# Patient Record
Sex: Female | Born: 1975 | Race: Black or African American | Hispanic: No | Marital: Single | Smoking: Never smoker
Health system: Southern US, Community
[De-identification: ages and names within clinical notes are randomized; demographics above are authoritative.]

## PROBLEM LIST (undated history)

## (undated) DIAGNOSIS — Z789 Other specified health status: Secondary | ICD-10-CM

## (undated) HISTORY — DX: Other specified health status: Z78.9

## (undated) HISTORY — PX: NO PAST SURGERIES: SHX2092

---

## 2015-08-02 LAB — OB RESULTS CONSOLE ABO/RH: RH Type: POSITIVE

## 2015-08-02 LAB — OB RESULTS CONSOLE HIV ANTIBODY (ROUTINE TESTING): HIV: NONREACTIVE

## 2015-08-02 LAB — OB RESULTS CONSOLE HGB/HCT, BLOOD: Hemoglobin: 12.1 g/dL

## 2015-08-02 LAB — OB RESULTS CONSOLE PLATELET COUNT: PLATELETS: 198 10*3/uL

## 2015-09-10 LAB — OB RESULTS CONSOLE HGB/HCT, BLOOD: Hemoglobin: 12 g/dL

## 2015-09-10 LAB — OB RESULTS CONSOLE PLATELET COUNT: PLATELETS: 172 10*3/uL

## 2015-12-24 LAB — OB RESULTS CONSOLE HGB/HCT, BLOOD: HEMOGLOBIN: 12.3 g/dL

## 2015-12-24 LAB — OB RESULTS CONSOLE PLATELET COUNT: PLATELETS: 142 10*3/uL

## 2016-01-18 ENCOUNTER — Encounter: Payer: Self-pay | Admitting: Student

## 2016-01-18 ENCOUNTER — Encounter: Payer: Self-pay | Admitting: *Deleted

## 2016-01-18 ENCOUNTER — Ambulatory Visit (INDEPENDENT_AMBULATORY_CARE_PROVIDER_SITE_OTHER): Payer: Self-pay | Admitting: Student

## 2016-01-18 VITALS — BP 100/54 | HR 74 | Wt 188.6 lb

## 2016-01-18 DIAGNOSIS — O26893 Other specified pregnancy related conditions, third trimester: Secondary | ICD-10-CM | POA: Diagnosis not present

## 2016-01-18 DIAGNOSIS — Z1151 Encounter for screening for human papillomavirus (HPV): Secondary | ICD-10-CM

## 2016-01-18 DIAGNOSIS — Z23 Encounter for immunization: Secondary | ICD-10-CM

## 2016-01-18 DIAGNOSIS — N898 Other specified noninflammatory disorders of vagina: Secondary | ICD-10-CM | POA: Diagnosis not present

## 2016-01-18 DIAGNOSIS — Z3490 Encounter for supervision of normal pregnancy, unspecified, unspecified trimester: Secondary | ICD-10-CM | POA: Insufficient documentation

## 2016-01-18 DIAGNOSIS — O0933 Supervision of pregnancy with insufficient antenatal care, third trimester: Secondary | ICD-10-CM

## 2016-01-18 LAB — POCT URINALYSIS DIP (DEVICE)
BILIRUBIN URINE: NEGATIVE
Glucose, UA: NEGATIVE mg/dL
HGB URINE DIPSTICK: NEGATIVE
KETONES UR: NEGATIVE mg/dL
Leukocytes, UA: NEGATIVE
NITRITE: NEGATIVE
PH: 7 (ref 5.0–8.0)
PROTEIN: NEGATIVE mg/dL
Specific Gravity, Urine: 1.02 (ref 1.005–1.030)
Urobilinogen, UA: 1 mg/dL (ref 0.0–1.0)

## 2016-01-18 NOTE — Progress Notes (Signed)
  Subjective:    Alejandra Peterson is a G4P0030 183w4d being seen today for her first obstetrical visit.  Her obstetrical history is significant for advanced maternal age, large for gestational age and late [place] Patient does intend to breast feed. FOB not involved. Pregnancy history fully reviewed.  Patient reports no complaints.  Filed Vitals:   01/18/16 0940  BP: 100/54  Pulse: 74  Weight: 188 lb 9.6 oz (85.548 kg)    HISTORY: OB History  Gravida Para Term Preterm AB SAB TAB Ectopic Multiple Living  4 0 0 0 3 3 0 0 0 0     # Outcome Date GA Lbr Len/2nd Weight Sex Delivery Anes PTL Lv  4 Current           3 SAB           2 SAB           1 SAB              Past Medical History  Diagnosis Date  . Medical history non-contributory    Past Surgical History  Procedure Laterality Date  . No past surgeries     No family history on file.   Exam   FHT 147 by doppler  Uterus:  Fundal Height: 35 cm  Pelvic Exam:    Perineum: No Hemorrhoids   Vulva: normal   Vagina:  thin grey discharge, wet prep done   Cervix: no bleeding following Pap, no lesions, nulliparous appearance    Skin: normal coloration and turgor, no rashes    Neurologic: oriented, normal   Extremities: normal strength, tone, and muscle mass, no deformities   HEENT PERRLA   Mouth/Teeth mucous membranes moist, pharynx normal without lesions and dental hygiene good   Neck supple and no masses   Cardiovascular: regular rate and rhythm, no murmurs or gallops   Respiratory:  appears well, vitals normal, no respiratory distress, acyanotic, normal RR, neck free of mass or lymphadenopathy   Abdomen: soft, non-tender; bowel sounds normal; no masses,  no organomegaly   Urinary: urethral meatus normal      Assessment:    Pregnancy: G4P0030 Patient Active Problem List   Diagnosis Date Noted  . Supervision of low-risk pregnancy 01/18/2016        Plan:  1. Late prenatal  care, third trimester  - Prescript Monitor Profile(19) - Prenatal Profile - Hemoglobinopathy evaluation - Culture, OB Urine - GC/Chlamydia probe amp ([place]not [facility] - US MFM OB COMP + 14 WK; Future - Glucose Tolerance, 1 HR (50g) w/o Fasting - Cytology - PAP - Tdap vaccine greater than or equal to 7yo IM - Flu Vaccine QUAD 36+ mos IM  2. Supervision of low-risk pregnancy, unspecified trimester   3. Vaginal discharge during pregnancy in third trimester  - Wet prep, genital    Initial labs drawn. Prenatal vitamins. Problem list reviewed and updated.  Ultrasound discussed; fetal survey: ordered.  Follow up in 2 weeks.    Judeth Hornrin Jadie Allington 01/18/2016

## 2016-01-18 NOTE — Progress Notes (Signed)
tdap given Flu given PAP Needed

## 2016-01-18 NOTE — Patient Instructions (Signed)
Third Trimester of Pregnancy The third trimester is from week 29 through week 42, months 7 through 9. The third trimester is a time when the fetus is growing rapidly. At the end of the ninth month, the fetus is about 20 inches in length and weighs 6-10 pounds.  BODY CHANGES Your body goes through many changes during pregnancy. The changes vary from woman to woman.   Your weight will continue to increase. You can expect to gain 25-35 pounds (11-16 kg) by the end of the pregnancy.  You may begin to get stretch marks on your hips, abdomen, and breasts.  You may urinate more often because the fetus is moving lower into your pelvis and pressing on your bladder.  You may develop or continue to have heartburn as a result of your pregnancy.  You may develop constipation because certain hormones are causing the muscles that push waste through your intestines to slow down.  You may develop hemorrhoids or swollen, bulging veins (varicose veins).  You may have pelvic pain because of the weight gain and pregnancy hormones relaxing your joints between the bones in your pelvis. Backaches may result from overexertion of the muscles supporting your posture.  You may have changes in your hair. These can include thickening of your hair, rapid growth, and changes in texture. Some women also have hair loss during or after pregnancy, or hair that feels dry or thin. Your hair will most likely return to normal after your baby is born.  Your breasts will continue to grow and be tender. A yellow discharge may leak from your breasts called colostrum.  Your belly button may stick out.  You may feel short of breath because of your expanding uterus.  You may notice the fetus "dropping," or moving lower in your abdomen.  You may have a bloody mucus discharge. This usually occurs a few days to a week before labor begins.  Your cervix becomes thin and soft (effaced) near your due date. WHAT TO EXPECT AT YOUR PRENATAL  EXAMS  You will have prenatal exams every 2 weeks until week 36. Then, you will have weekly prenatal exams. During a routine prenatal visit:  You will be weighed to make sure you and the fetus are growing normally.  Your blood pressure is taken.  Your abdomen will be measured to track your baby's growth.  The fetal heartbeat will be listened to.  Any test results from the previous visit will be discussed.  You may have a cervical check near your due date to see if you have effaced. At around 36 weeks, your caregiver will check your cervix. At the same time, your caregiver will also perform a test on the secretions of the vaginal tissue. This test is to determine if a type of bacteria, Group B streptococcus, is present. Your caregiver will explain this further. Your caregiver may ask you:  What your birth plan is.  How you are feeling.  If you are feeling the baby move.  If you have had any abnormal symptoms, such as leaking fluid, bleeding, severe headaches, or abdominal cramping.  If you are using any tobacco products, including cigarettes, chewing tobacco, and electronic cigarettes.  If you have any questions. Other tests or screenings that may be performed during your third trimester include:  Blood tests that check for low iron levels (anemia).  Fetal testing to check the health, activity level, and growth of the fetus. Testing is done if you have certain medical conditions or if   there are problems during the pregnancy.  HIV (human immunodeficiency virus) testing. If you are at high risk, you may be screened for HIV during your third trimester of pregnancy. FALSE LABOR You may feel small, irregular contractions that eventually go away. These are called Braxton Hicks contractions, or false labor. Contractions may last for hours, days, or even weeks before true labor sets in. If contractions come at regular intervals, intensify, or become painful, it is best to be seen by your  caregiver.  SIGNS OF LABOR   Menstrual-like cramps.  Contractions that are 5 minutes apart or less.  Contractions that start on the top of the uterus and spread down to the lower abdomen and back.  A sense of increased pelvic pressure or back pain.  A watery or bloody mucus discharge that comes from the vagina. If you have any of these signs before the 37th week of pregnancy, call your caregiver right away. You need to go to the hospital to get checked immediately. HOME CARE INSTRUCTIONS   Avoid all smoking, herbs, alcohol, and unprescribed drugs. These chemicals affect the formation and growth of the baby.  Do not use any tobacco products, including cigarettes, chewing tobacco, and electronic cigarettes. If you need help quitting, ask your health care provider. You may receive counseling support and other resources to help you quit.  Follow your caregiver's instructions regarding medicine use. There are medicines that are either safe or unsafe to take during pregnancy.  Exercise only as directed by your caregiver. Experiencing uterine cramps is a good sign to stop exercising.  Continue to eat regular, healthy meals.  Wear a good support bra for breast tenderness.  Do not use hot tubs, steam rooms, or saunas.  Wear your seat belt at all times when driving.  Avoid raw meat, uncooked cheese, cat litter boxes, and soil used by cats. These carry germs that can cause birth defects in the baby.  Take your prenatal vitamins.  Take 1500-2000 mg of calcium daily starting at the 20th week of pregnancy until you deliver your baby.  Try taking a stool softener (if your caregiver approves) if you develop constipation. Eat more high-fiber foods, such as fresh vegetables or fruit and whole grains. Drink plenty of fluids to keep your urine clear or pale yellow.  Take warm sitz baths to soothe any pain or discomfort caused by hemorrhoids. Use hemorrhoid cream if your caregiver approves.  If  you develop varicose veins, wear support hose. Elevate your feet for 15 minutes, 3-4 times a day. Limit salt in your diet.  Avoid heavy lifting, wear low heal shoes, and practice good posture.  Rest a lot with your legs elevated if you have leg cramps or low back pain.  Visit your dentist if you have not gone during your pregnancy. Use a soft toothbrush to brush your teeth and be gentle when you floss.  A sexual relationship may be continued unless your caregiver directs you otherwise.  Do not travel far distances unless it is absolutely necessary and only with the approval of your caregiver.  Take prenatal classes to understand, practice, and ask questions about the labor and delivery.  Make a trial run to the hospital.  Pack your hospital bag.  Prepare the baby's nursery.  Continue to go to all your prenatal visits as directed by your caregiver. SEEK MEDICAL CARE IF:  You are unsure if you are in labor or if your water has broken.  You have dizziness.  You have   mild pelvic cramps, pelvic pressure, or nagging pain in your abdominal area.  You have persistent nausea, vomiting, or diarrhea.  You have a bad smelling vaginal discharge.  You have pain with urination. SEEK IMMEDIATE MEDICAL CARE IF:   You have a fever.  You are leaking fluid from your vagina.  You have spotting or bleeding from your vagina.  You have severe abdominal cramping or pain.  You have rapid weight loss or gain.  You have shortness of breath with chest pain.  You notice sudden or extreme swelling of your face, hands, ankles, feet, or legs.  You have not felt your baby move in over an hour.  You have severe headaches that do not go away with medicine.  You have vision changes.   This information is not intended to replace advice given to you by your health care provider. Make sure you discuss any questions you have with your health care provider.   Document Released: 08/28/2001 Document  Revised: 09/24/2014 Document Reviewed: 11/04/2012 Elsevier Interactive Patient Education 2016 [employer]   Preterm Labor Information Preterm labor is when labor starts at less than 37 weeks of pregnancy. The normal length of a pregnancy is 39 to 41 weeks. CAUSES Often, there is no identifiable underlying cause as to why a woman goes into preterm labor. One of the most common known causes of preterm labor is infection. Infections of the uterus, cervix, vagina, amniotic sac, bladder, kidney, or even the lungs (pneumonia) can cause labor to start. Other suspected causes of preterm labor include:   Urogenital infections, such as yeast infections and bacterial vaginosis.   Uterine abnormalities (uterine shape, uterine septum, fibroids, or bleeding from the placenta).   A cervix that has been operated on (it may fail to stay closed).   Malformations in the fetus.   Multiple gestations (twins, triplets, and so on).   Breakage of the amniotic sac.  RISK FACTORS  Having a previous history of preterm labor.   Having premature rupture of membranes (PROM).   Having a placenta that covers the opening of the cervix (placenta previa).   Having a placenta that separates from the uterus (placental abruption).   Having a cervix that is too weak to hold the fetus in the uterus (incompetent cervix).   Having too much fluid in the amniotic sac (polyhydramnios).   Taking illegal drugs or smoking while pregnant.   Not gaining enough weight while pregnant.   Being younger than 18 and older than 39 years old.   Having a low socioeconomic status.   Being African American. SYMPTOMS Signs and symptoms of preterm labor include:   Menstrual-like cramps, abdominal pain, or back pain.  Uterine contractions that are regular, as frequent as six in an hour, regardless of their intensity (may be mild or painful).  Contractions that start on the top of the uterus and spread down to  the lower abdomen and back.   A sense of increased pelvic pressure.   A watery or bloody mucus discharge that comes from the vagina.  TREATMENT Depending on the length of the pregnancy and other circumstances, your health care provider may suggest bed rest. If necessary, there are medicines that can be given to stop contractions and to mature the fetal lungs. If labor happens before 34 weeks of pregnancy, a prolonged hospital stay may be recommended. Treatment depends on the condition of both you and the fetus.  WHAT SHOULD YOU DO IF YOU THINK YOU ARE IN PRETERM LABOR?   Call your health care provider right away. You will need to go to the hospital to get checked immediately. HOW CAN YOU PREVENT PRETERM LABOR IN FUTURE PREGNANCIES? You should:   Stop smoking if you smoke.  Maintain healthy weight gain and avoid chemicals and drugs that are not necessary.  Be watchful for any type of infection.  Inform your health care provider if you have a known history of preterm labor.   This information is not intended to replace advice given to you by your health care provider. Make sure you discuss any questions you have with your health care provider.   Document Released: 11/24/2003 Document Revised: 05/06/2013 Document Reviewed: 10/06/2012 Elsevier Interactive Patient Education 2016 [employer] Inc[employer]  

## 2016-01-19 ENCOUNTER — Other Ambulatory Visit: Payer: Self-pay | Admitting: Student

## 2016-01-19 ENCOUNTER — Encounter: Payer: Self-pay | Admitting: Student

## 2016-01-19 DIAGNOSIS — B3731 Acute candidiasis of vulva and vagina: Secondary | ICD-10-CM

## 2016-01-19 DIAGNOSIS — D696 Thrombocytopenia, unspecified: Secondary | ICD-10-CM | POA: Insufficient documentation

## 2016-01-19 DIAGNOSIS — N760 Acute vaginitis: Principal | ICD-10-CM

## 2016-01-19 DIAGNOSIS — B373 Candidiasis of vulva and vagina: Secondary | ICD-10-CM

## 2016-01-19 DIAGNOSIS — O99119 Other diseases of the blood and blood-forming organs and certain disorders involving the immune mechanism complicating pregnancy, unspecified trimester: Secondary | ICD-10-CM

## 2016-01-19 DIAGNOSIS — B9689 Other specified bacterial agents as the cause of diseases classified elsewhere: Secondary | ICD-10-CM

## 2016-01-19 LAB — PRESCRIPTION MONITORING PROFILE (19 PANEL)
AMPHETAMINE/METH: NEGATIVE ng/mL
BARBITURATE SCREEN, URINE: NEGATIVE ng/mL
BUPRENORPHINE, URINE: NEGATIVE ng/mL
Benzodiazepine Screen, Urine: NEGATIVE ng/mL
CANNABINOID SCRN UR: NEGATIVE ng/mL
Carisoprodol, Urine: NEGATIVE ng/mL
Cocaine Metabolites: NEGATIVE ng/mL
Creatinine, Urine: 85.57 mg/dL (ref >20.0)
Fentanyl, Ur: NEGATIVE ng/mL
MDMA URINE: NEGATIVE ng/mL
METHADONE SCREEN, URINE: NEGATIVE ng/mL
METHAQUALONE SCREEN (URINE): NEGATIVE ng/mL
Meperidine, Ur: NEGATIVE ng/mL
NITRITES URINE, INITIAL: NEGATIVE ug/mL
OPIATE SCREEN, URINE: NEGATIVE ng/mL
Oxycodone Screen, Ur: NEGATIVE ng/mL
PHENCYCLIDINE, UR: NEGATIVE ng/mL
Propoxyphene: NEGATIVE ng/mL
TAPENTADOLUR: NEGATIVE ng/mL
Tramadol Scrn, Ur: NEGATIVE ng/mL
ZOLPIDEM, URINE: NEGATIVE ng/mL
pH, Initial: 7.7 pH (ref 4.5–8.9)

## 2016-01-19 LAB — PRENATAL PROFILE (SOLSTAS)
Antibody Screen: NEGATIVE
BASOS ABS: 0 {cells}/uL (ref 0–200)
Basophils Relative: 0 %
EOS PCT: 1 %
Eosinophils Absolute: 59 cells/uL (ref 15–500)
HCT: 35.2 % (ref 35.0–45.0)
HEMOGLOBIN: 11.9 g/dL (ref 11.7–15.5)
HEP B S AG: NEGATIVE
HIV: NONREACTIVE
LYMPHS ABS: 1180 {cells}/uL (ref 850–3900)
Lymphocytes Relative: 20 %
MCH: 32.6 pg (ref 27.0–33.0)
MCHC: 33.8 g/dL (ref 32.0–36.0)
MCV: 96.4 fL (ref 80.0–100.0)
MPV: 12 fL (ref 7.5–12.5)
Monocytes Absolute: 413 cells/uL (ref 200–950)
Monocytes Relative: 7 %
NEUTROS PCT: 72 %
Neutro Abs: 4248 cells/uL (ref 1500–7800)
Platelets: 108 10*3/uL — ABNORMAL LOW (ref 140–400)
RBC: 3.65 MIL/uL — ABNORMAL LOW (ref 3.80–5.10)
RDW: 13.6 % (ref 11.0–15.0)
RUBELLA: 8.34 {index} — AB (ref <0.90)
Rh Type: POSITIVE
WBC: 5.9 10*3/uL (ref 3.8–10.8)

## 2016-01-19 LAB — GC/CHLAMYDIA PROBE AMP (~~LOC~~) NOT AT ARMC
Chlamydia: NEGATIVE
Neisseria Gonorrhea: NEGATIVE

## 2016-01-19 LAB — WET PREP, GENITAL: TRICH WET PREP: NONE SEEN

## 2016-01-19 LAB — GLUCOSE TOLERANCE, 1 HOUR (50G) W/O FASTING: Glucose, 1 Hr, gestational: 98 mg/dL (ref <140)

## 2016-01-19 MED ORDER — METRONIDAZOLE 500 MG PO TABS: 500.0000 mg | ORAL_TABLET | Freq: Two times a day (BID) | ORAL | Status: DC

## 2016-01-19 MED ORDER — TERCONAZOLE 0.8 % VA CREA: 1.0000 | TOPICAL_CREAM | Freq: Every day | VAGINAL | Status: DC

## 2016-01-20 ENCOUNTER — Telehealth: Payer: Self-pay

## 2016-01-20 LAB — CULTURE, OB URINE

## 2016-01-20 LAB — CYTOLOGY - PAP

## 2016-01-20 LAB — HEMOGLOBINOPATHY EVALUATION
HGB A2 QUANT: 2.6 % (ref 2.2–3.2)
HGB A: 97.2 % (ref 96.8–97.8)
Hemoglobin Other: 0 %
Hgb F Quant: 0.2 % (ref 0.0–2.0)
Hgb S Quant: 0 %

## 2016-01-20 NOTE — Telephone Encounter (Signed)
Rx sent for flagyl & terazol for yeast & BV.      I have left a message for patient to returned call to the clinic. Can patient come in next week for labs only visit (needs CBC, CMP, urine protein creatinine ratio

## 2016-01-30 ENCOUNTER — Ambulatory Visit (HOSPITAL_COMMUNITY)
Admission: RE | Admit: 2016-01-30 | Discharge: 2016-01-30 | Disposition: A | Discharge status: Home / Self Care | Payer: Medicaid Other | Source: Ambulatory Visit | Attending: Student | Admitting: Student

## 2016-01-30 ENCOUNTER — Other Ambulatory Visit: Payer: Self-pay | Admitting: Student

## 2016-01-30 DIAGNOSIS — O09523 Supervision of elderly multigravida, third trimester: Secondary | ICD-10-CM

## 2016-01-30 DIAGNOSIS — O2623 Pregnancy care for patient with recurrent pregnancy loss, third trimester: Secondary | ICD-10-CM

## 2016-01-30 DIAGNOSIS — Z1389 Encounter for screening for other disorder: Secondary | ICD-10-CM

## 2016-01-30 DIAGNOSIS — Z3493 Encounter for supervision of normal pregnancy, unspecified, third trimester: Secondary | ICD-10-CM

## 2016-01-30 DIAGNOSIS — Z36 Encounter for antenatal screening of mother: Secondary | ICD-10-CM | POA: Diagnosis not present

## 2016-01-30 DIAGNOSIS — O0933 Supervision of pregnancy with insufficient antenatal care, third trimester: Secondary | ICD-10-CM

## 2016-01-30 DIAGNOSIS — Z3A35 35 weeks gestation of pregnancy: Secondary | ICD-10-CM

## 2016-01-30 DIAGNOSIS — Z363 Encounter for antenatal screening for malformations: Secondary | ICD-10-CM

## 2016-02-06 ENCOUNTER — Encounter: Payer: Self-pay | Admitting: General Practice

## 2016-02-06 NOTE — Telephone Encounter (Signed)
Called patient, no answer- left message to call us back at the clinics for results. Will send letter

## 2016-02-15 ENCOUNTER — Ambulatory Visit (INDEPENDENT_AMBULATORY_CARE_PROVIDER_SITE_OTHER): Payer: Self-pay | Admitting: Medical

## 2016-02-15 ENCOUNTER — Ambulatory Visit (HOSPITAL_COMMUNITY): Payer: Self-pay

## 2016-02-15 VITALS — BP 109/64 | HR 88 | Wt 186.8 lb

## 2016-02-15 DIAGNOSIS — D696 Thrombocytopenia, unspecified: Secondary | ICD-10-CM

## 2016-02-15 DIAGNOSIS — O288 Other abnormal findings on antenatal screening of mother: Secondary | ICD-10-CM

## 2016-02-15 DIAGNOSIS — Z3493 Encounter for supervision of normal pregnancy, unspecified, third trimester: Secondary | ICD-10-CM

## 2016-02-15 DIAGNOSIS — Z113 Encounter for screening for infections with a predominantly sexual mode of transmission: Secondary | ICD-10-CM

## 2016-02-15 DIAGNOSIS — O289 Unspecified abnormal findings on antenatal screening of mother: Secondary | ICD-10-CM

## 2016-02-15 DIAGNOSIS — O99119 Other diseases of the blood and blood-forming organs and certain disorders involving the immune mechanism complicating pregnancy, unspecified trimester: Secondary | ICD-10-CM

## 2016-02-15 DIAGNOSIS — O99113 Other diseases of the blood and blood-forming organs and certain disorders involving the immune mechanism complicating pregnancy, third trimester: Secondary | ICD-10-CM

## 2016-02-15 DIAGNOSIS — O368390 Maternal care for abnormalities of the fetal heart rate or rhythm, unspecified trimester, not applicable or unspecified: Secondary | ICD-10-CM

## 2016-02-15 LAB — CBC
HEMATOCRIT: 35.2 % (ref 35.0–45.0)
HEMOGLOBIN: 12.1 g/dL (ref 11.7–15.5)
MCH: 32.6 pg (ref 27.0–33.0)
MCHC: 34.4 g/dL (ref 32.0–36.0)
MCV: 94.9 fL (ref 80.0–100.0)
MPV: 12 fL (ref 7.5–12.5)
Platelets: 123 10*3/uL — ABNORMAL LOW (ref 140–400)
RBC: 3.71 MIL/uL — AB (ref 3.80–5.10)
RDW: 13.8 % (ref 11.0–15.0)
WBC: 6.1 10*3/uL (ref 3.8–10.8)

## 2016-02-15 LAB — POCT URINALYSIS DIP (DEVICE)
Glucose, UA: 100 mg/dL — AB
HGB URINE DIPSTICK: NEGATIVE
LEUKOCYTES UA: NEGATIVE
NITRITE: NEGATIVE
PH: 5.5 (ref 5.0–8.0)
Protein, ur: 100 mg/dL — AB
Specific Gravity, Urine: 1.025 (ref 1.005–1.030)
Urobilinogen, UA: 1 mg/dL (ref 0.0–1.0)

## 2016-02-15 LAB — OB RESULTS CONSOLE GBS: STREP GROUP B AG: NEGATIVE

## 2016-02-15 LAB — OB RESULTS CONSOLE GC/CHLAMYDIA: GC PROBE AMP, GENITAL: NEGATIVE

## 2016-02-15 MED ORDER — PRENATAL VITAMINS 0.8 MG PO TABS: 1.0000 | ORAL_TABLET | Freq: Every day | ORAL | Status: AC

## 2016-02-15 NOTE — Patient Instructions (Signed)
Fetal Movement Counts Patient Name: __________________________________________________ Patient Due Date: ____________________ Performing a fetal movement count is highly recommended in high-risk pregnancies, but it is good for every pregnant woman to do. Your health care provider may ask you to start counting fetal movements at 28 weeks of the pregnancy. Fetal movements often increase:  After eating a full meal.  After physical activity.  After eating or drinking something sweet or cold.  At rest. Pay attention to when you feel the baby is most active. This will help you notice a pattern of your baby's sleep and wake cycles and what factors contribute to an increase in fetal movement. It is important to perform a fetal movement count at the same time each day when your baby is normally most active.  HOW TO COUNT FETAL MOVEMENTS 1. Find a quiet and comfortable area to sit or lie down on your left side. Lying on your left side provides the best blood and oxygen circulation to your baby. 2. Write down the day and time on a sheet of paper or in a journal. 3. Start counting kicks, flutters, swishes, rolls, or jabs in a 2-hour period. You should feel at least 10 movements within 2 hours. 4. If you do not feel 10 movements in 2 hours, wait 2-3 hours and count again. Look for a change in the pattern or not enough counts in 2 hours. SEEK MEDICAL CARE IF:  You feel less than 10 counts in 2 hours, tried twice.  There is no movement in over an hour.  The pattern is changing or taking longer each day to reach 10 counts in 2 hours.  You feel the baby is not moving as he or she usually does. Date: ____________ Movements: ____________ Start time: ____________ Finish time: ____________  Date: ____________ Movements: ____________ Start time: ____________ Finish time: ____________ Date: ____________ Movements: ____________ Start time: ____________ Finish time: ____________ Date: ____________ Movements:  ____________ Start time: ____________ Finish time: ____________ Date: ____________ Movements: ____________ Start time: ____________ Finish time: ____________ Date: ____________ Movements: ____________ Start time: ____________ Finish time: ____________ Date: ____________ Movements: ____________ Start time: ____________ Finish time: ____________ Date: ____________ Movements: ____________ Start time: ____________ Finish time: ____________  Date: ____________ Movements: ____________ Start time: ____________ Finish time: ____________ Date: ____________ Movements: ____________ Start time: ____________ Finish time: ____________ Date: ____________ Movements: ____________ Start time: ____________ Finish time: ____________ Date: ____________ Movements: ____________ Start time: ____________ Finish time: ____________ Date: ____________ Movements: ____________ Start time: ____________ Finish time: ____________ Date: ____________ Movements: ____________ Start time: ____________ Finish time: ____________ Date: ____________ Movements: ____________ Start time: ____________ Finish time: ____________  Date: ____________ Movements: ____________ Start time: ____________ Finish time: ____________ Date: ____________ Movements: ____________ Start time: ____________ Finish time: ____________ Date: ____________ Movements: ____________ Start time: ____________ Finish time: ____________ Date: ____________ Movements: ____________ Start time: ____________ Finish time: ____________ Date: ____________ Movements: ____________ Start time: ____________ Finish time: ____________ Date: ____________ Movements: ____________ Start time: ____________ Finish time: ____________ Date: ____________ Movements: ____________ Start time: ____________ Finish time: ____________  Date: ____________ Movements: ____________ Start time: ____________ Finish time: ____________ Date: ____________ Movements: ____________ Start time: ____________ Finish  time: ____________ Date: ____________ Movements: ____________ Start time: ____________ Finish time: ____________ Date: ____________ Movements: ____________ Start time: ____________ Finish time: ____________ Date: ____________ Movements: ____________ Start time: ____________ Finish time: ____________ Date: ____________ Movements: ____________ Start time: ____________ Finish time: ____________ Date: ____________ Movements: ____________ Start time: ____________ Finish time: ____________  Date: ____________ Movements: ____________ Start time: ____________ Finish   time: ____________ Date: ____________ Movements: ____________ Start time: ____________ Finish time: ____________ Date: ____________ Movements: ____________ Start time: ____________ Finish time: ____________ Date: ____________ Movements: ____________ Start time: ____________ Finish time: ____________ Date: ____________ Movements: ____________ Start time: ____________ Finish time: ____________ Date: ____________ Movements: ____________ Start time: ____________ Finish time: ____________ Date: ____________ Movements: ____________ Start time: ____________ Finish time: ____________  Date: ____________ Movements: ____________ Start time: ____________ Finish time: ____________ Date: ____________ Movements: ____________ Start time: ____________ Finish time: ____________ Date: ____________ Movements: ____________ Start time: ____________ Finish time: ____________ Date: ____________ Movements: ____________ Start time: ____________ Finish time: ____________ Date: ____________ Movements: ____________ Start time: ____________ Finish time: ____________ Date: ____________ Movements: ____________ Start time: ____________ Finish time: ____________ Date: ____________ Movements: ____________ Start time: ____________ Finish time: ____________  Date: ____________ Movements: ____________ Start time: ____________ Finish time: ____________ Date: ____________  Movements: ____________ Start time: ____________ Finish time: ____________ Date: ____________ Movements: ____________ Start time: ____________ Finish time: ____________ Date: ____________ Movements: ____________ Start time: ____________ Finish time: ____________ Date: ____________ Movements: ____________ Start time: ____________ Finish time: ____________ Date: ____________ Movements: ____________ Start time: ____________ Finish time: ____________ Date: ____________ Movements: ____________ Start time: ____________ Finish time: ____________  Date: ____________ Movements: ____________ Start time: ____________ Finish time: ____________ Date: ____________ Movements: ____________ Start time: ____________ Finish time: ____________ Date: ____________ Movements: ____________ Start time: ____________ Finish time: ____________ Date: ____________ Movements: ____________ Start time: ____________ Finish time: ____________ Date: ____________ Movements: ____________ Start time: ____________ Finish time: ____________ Date: ____________ Movements: ____________ Start time: ____________ Finish time: ____________   This information is not intended to replace advice given to you by your health care provider. Make sure you discuss any questions you have with your health care provider.   Document Released: 10/03/2006 Document Revised: 09/24/2014 Document Reviewed: 06/30/2012 Elsevier Interactive Patient Education 2016 [employer] Braxton Hicks Contractions Contractions of the uterus can occur throughout pregnancy. Contractions are not always a sign that you are in labor.  WHAT ARE BRAXTON HICKS CONTRACTIONS?  Contractions that occur before labor are called Braxton Hicks contractions, or false labor. Toward the end of pregnancy (32-34 weeks), these contractions can develop more often and may become more forceful. This is not true labor because these contractions do not result in opening (dilatation) and thinning of  the cervix. They are sometimes difficult to tell apart from true labor because these contractions can be forceful and people have different pain tolerances. You should not feel embarrassed if you go to the hospital with false labor. Sometimes, the only way to tell if you are in true labor is for your health care provider to look for changes in the cervix. If there are no prenatal problems or other health problems associated with the pregnancy, it is completely safe to be sent home with false labor and await the onset of true labor. HOW CAN YOU TELL THE DIFFERENCE BETWEEN TRUE AND FALSE LABOR? False Labor  The contractions of false labor are usually shorter and not as hard as those of true labor.   The contractions are usually irregular.   The contractions are often felt in the front of the lower abdomen and in the groin.   The contractions may go away when you walk around or change positions while lying down.   The contractions get weaker and are shorter lasting as time goes on.   The contractions do not usually become progressively stronger, regular, and closer together as with true labor.  True Labor 5. Contractions in true   labor last 30-70 seconds, become very regular, usually become more intense, and increase in frequency.  6. The contractions do not go away with walking.  7. The discomfort is usually felt in the top of the uterus and spreads to the lower abdomen and low back.  8. True labor can be determined by your health care provider with an exam. This will show that the cervix is dilating and getting thinner.  WHAT TO REMEMBER  Keep up with your usual exercises and follow other instructions given by your health care provider.   Take medicines as directed by your health care provider.   Keep your regular prenatal appointments.   Eat and drink lightly if you think you are going into labor.   If Braxton Hicks contractions are making you uncomfortable:   Change  your position from lying down or resting to walking, or from walking to resting.   Sit and rest in a tub of warm water.   Drink 2-3 glasses of water. Dehydration may cause these contractions.   Do slow and deep breathing several times an hour.  WHEN SHOULD I SEEK IMMEDIATE MEDICAL CARE? Seek immediate medical care if:  Your contractions become stronger, more regular, and closer together.   You have fluid leaking or gushing from your vagina.   You have a fever.   You pass blood-tinged mucus.   You have vaginal bleeding.   You have continuous abdominal pain.   You have low back pain that you never had before.   You feel your baby's head pushing down and causing pelvic pressure.   Your baby is not moving as much as it used to.    This information is not intended to replace advice given to you by your health care provider. Make sure you discuss any questions you have with your health care provider.   Document Released: 09/03/2005 Document Revised: 09/08/2013 Document Reviewed: 06/15/2013 Elsevier Interactive Patient Education 2016 Elsevier Inc[employer]  

## 2016-02-15 NOTE — Progress Notes (Signed)
Subjective:  Alejandra Peterson is a 40 y.o. G4P0030 at 4363w4d being seen today for ongoing prenatal care.  She is currently monitored for the following issues for this low-risk pregnancy and has Supervision of low-risk pregnancy and Thrombocytopenia during pregnancy (HCC) on her problem list.  Patient reports occasional contractions.  Contractions: Irritability. Vag. Bleeding: None.  Movement: Present. Denies leaking of fluid.   The following portions of the patient's history were reviewed and updated as appropriate: allergies, current medications, past family history, past medical history, past social history, past surgical history and problem list. Problem list updated.  Objective:   Filed Vitals:   02/15/16 1110  BP: 109/64  Pulse: 88  Weight: 186 lb 12.8 oz (84.732 kg)    Fetal Status: Fetal Heart Rate (bpm): 140 Fundal Height: 36 cm Movement: Present     General:  Alert, oriented and cooperative. Patient is in no acute distress.  Skin: Skin is warm and dry. No rash noted.   Cardiovascular: Normal heart rate noted  Respiratory: Normal respiratory effort, no problems with respiration noted  Abdomen: Soft, gravid, appropriate for gestational age. Pain/Pressure: Absent     Pelvic: Vag. Bleeding: None     Cervical exam performed       0/thick  Extremities: Normal range of motion.  Edema: None  Mental Status: Normal mood and affect. Normal behavior. Normal judgment and thought content.    Urinalysis: Urine Protein: 2+ Urine Glucose: 1+  Assessment and Plan:  Pregnancy: G4P0030 at 5863w4d  1. Supervision of low-risk pregnancy, third trimester - Prenatal Multivit-Min-Fe-FA (PRENATAL VITAMINS) 0.8 MG tablet; Take 1 tablet by mouth daily. (Patient not taking: Reported on 02/15/2016)  Dispense: 30 tablet; Refill: 12 - Culture, beta strep (group b only) - GC[facility]Chlamydia probe amp [facility] - NST today for possible fetal cardiac arrhythmia- fetal cardiac arrhythmia noted on  NST. Will begin twice weekly testing this week until delivery - CBC today for repeat testing for ptl count due to thrombocytopenia noted on 01/18/16  Preterm labor symptoms and general obstetric precautions including but not limited to vaginal bleeding, contractions, leaking of fluid and fetal movement were reviewed in detail with the patient. Please refer to After Visit Summary for other counseling recommendations.  Return in about 1 week (around 02/22/2016) for ROB.   Marny LowensteinJulie N Ryna Beckstrom, PA-C

## 2016-02-15 NOTE — Progress Notes (Signed)
FHT irregular Cultures today

## 2016-02-16 LAB — GC/CHLAMYDIA PROBE AMP (~~LOC~~) NOT AT ARMC
Chlamydia: NEGATIVE
Neisseria Gonorrhea: NEGATIVE

## 2016-02-17 LAB — CULTURE, BETA STREP (GROUP B ONLY)

## 2016-02-20 ENCOUNTER — Other Ambulatory Visit: Payer: Self-pay

## 2016-02-24 ENCOUNTER — Other Ambulatory Visit: Payer: Self-pay

## 2016-03-05 ENCOUNTER — Inpatient Hospital Stay (HOSPITAL_COMMUNITY): Payer: Medicaid Other | Admitting: Anesthesiology

## 2016-03-05 ENCOUNTER — Ambulatory Visit (INDEPENDENT_AMBULATORY_CARE_PROVIDER_SITE_OTHER): Payer: Self-pay | Admitting: Obstetrics and Gynecology

## 2016-03-05 VITALS — BP 127/73 | HR 65 | Wt 193.8 lb

## 2016-03-05 DIAGNOSIS — D696 Thrombocytopenia, unspecified: Secondary | ICD-10-CM

## 2016-03-05 DIAGNOSIS — O99113 Other diseases of the blood and blood-forming organs and certain disorders involving the immune mechanism complicating pregnancy, third trimester: Secondary | ICD-10-CM | POA: Diagnosis present

## 2016-03-05 DIAGNOSIS — Z36 Encounter for antenatal screening of mother: Secondary | ICD-10-CM

## 2016-03-05 DIAGNOSIS — O368390 Maternal care for abnormalities of the fetal heart rate or rhythm, unspecified trimester, not applicable or unspecified: Secondary | ICD-10-CM

## 2016-03-05 NOTE — Progress Notes (Signed)
Pt reports frequent UC's and headaches since last visit

## 2016-03-05 NOTE — Progress Notes (Signed)
Subjective:  Alejandra Peterson is a 40 y.o. G4P0030 at 972w2d being seen today for ongoing prenatal care.  She is currently monitored for the following issues for this low-risk pregnancy and has Supervision of low-risk pregnancy; Thrombocytopenia during pregnancy [facility](HCC); and Fetal arrhythmia affecting pregnancy, antepartum on her problem list.  Patient reports no complaints.  Contractions: Irregular. Vag. Bleeding: None.  Movement: Present. Denies leaking of fluid.   The following portions of the patient's history were reviewed and updated as appropriate: allergies, current medications, past family history, past medical history, past social history, past surgical history and problem list. Problem list updated.  Objective:   Filed Vitals:   03/05/16 1157  BP: 127/73  Pulse: 65  Weight: 193 lb 12.8 oz (87.907 kg)    Fetal Status: Fetal Heart Rate (bpm): NST   Movement: Present     General:  Alert, oriented and cooperative. Patient is in no acute distress.  Skin: Skin is warm and dry. No rash noted.   Cardiovascular: Normal heart rate noted  Respiratory: Normal respiratory effort, no problems with respiration noted  Abdomen: Soft, gravid, appropriate for gestational age. Pain/Pressure: Present     Pelvic: Cervical exam deferred        Extremities: Normal range of motion.     Mental Status: Normal mood and affect. Normal behavior. Normal judgment and thought content.   Urinalysis:      Assessment and Plan:  Pregnancy: G4P0030 at 3072w2d  1. Fetal arrhythmia affecting pregnancy, antepartum - nst today with afi. nst reactive w/ vibroacoustic stimulation. afi wnl, vertex  Term labor symptoms and general obstetric precautions including but not limited to vaginal bleeding, contractions, leaking of fluid and fetal movement were reviewed in detail with the patient. Please refer to After Visit Summary for other counseling recommendations.  No Follow-up on file.   Kathrynn RunningNoah Bedford Alejandra Roussin,  MD

## 2016-03-06 ENCOUNTER — Encounter (HOSPITAL_COMMUNITY): Payer: Self-pay

## 2016-03-06 ENCOUNTER — Inpatient Hospital Stay (HOSPITAL_COMMUNITY)
Admission: AD | Admit: 2016-03-06 | Discharge: 2016-03-14 | DRG: 765 | Disposition: A | Discharge status: Home / Self Care | Payer: Medicaid Other | Source: Ambulatory Visit | Attending: Family Medicine | Admitting: Family Medicine

## 2016-03-06 DIAGNOSIS — Z98891 History of uterine scar from previous surgery: Secondary | ICD-10-CM

## 2016-03-06 DIAGNOSIS — N719 Inflammatory disease of uterus, unspecified: Secondary | ICD-10-CM | POA: Diagnosis not present

## 2016-03-06 DIAGNOSIS — O411230 Chorioamnionitis, third trimester, not applicable or unspecified: Secondary | ICD-10-CM | POA: Diagnosis present

## 2016-03-06 DIAGNOSIS — Z79899 Other long term (current) drug therapy: Secondary | ICD-10-CM

## 2016-03-06 DIAGNOSIS — O324XX0 Maternal care for high head at term, not applicable or unspecified: Secondary | ICD-10-CM | POA: Diagnosis present

## 2016-03-06 DIAGNOSIS — O411290 Chorioamnionitis, unspecified trimester, not applicable or unspecified: Secondary | ICD-10-CM | POA: Diagnosis not present

## 2016-03-06 DIAGNOSIS — D696 Thrombocytopenia, unspecified: Secondary | ICD-10-CM

## 2016-03-06 DIAGNOSIS — O9912 Other diseases of the blood and blood-forming organs and certain disorders involving the immune mechanism complicating childbirth: Secondary | ICD-10-CM | POA: Diagnosis present

## 2016-03-06 DIAGNOSIS — Z3A39 39 weeks gestation of pregnancy: Secondary | ICD-10-CM | POA: Diagnosis present

## 2016-03-06 DIAGNOSIS — R109 Unspecified abdominal pain: Secondary | ICD-10-CM

## 2016-03-06 DIAGNOSIS — Z3490 Encounter for supervision of normal pregnancy, unspecified, unspecified trimester: Secondary | ICD-10-CM

## 2016-03-06 DIAGNOSIS — K9189 Other postprocedural complications and disorders of digestive system: Secondary | ICD-10-CM | POA: Diagnosis not present

## 2016-03-06 DIAGNOSIS — O3663X0 Maternal care for excessive fetal growth, third trimester, not applicable or unspecified: Secondary | ICD-10-CM | POA: Diagnosis not present

## 2016-03-06 DIAGNOSIS — O1415 Severe pre-eclampsia, complicating the puerperium: Secondary | ICD-10-CM | POA: Diagnosis not present

## 2016-03-06 DIAGNOSIS — O99119 Other diseases of the blood and blood-forming organs and certain disorders involving the immune mechanism complicating pregnancy, unspecified trimester: Secondary | ICD-10-CM | POA: Diagnosis present

## 2016-03-06 DIAGNOSIS — K567 Ileus, unspecified: Secondary | ICD-10-CM | POA: Diagnosis not present

## 2016-03-06 DIAGNOSIS — Z3493 Encounter for supervision of normal pregnancy, unspecified, third trimester: Secondary | ICD-10-CM

## 2016-03-06 DIAGNOSIS — O1410 Severe pre-eclampsia, unspecified trimester: Secondary | ICD-10-CM

## 2016-03-06 DIAGNOSIS — O864 Pyrexia of unknown origin following delivery: Secondary | ICD-10-CM

## 2016-03-06 DIAGNOSIS — D6959 Other secondary thrombocytopenia: Secondary | ICD-10-CM | POA: Diagnosis present

## 2016-03-06 DIAGNOSIS — O368390 Maternal care for abnormalities of the fetal heart rate or rhythm, unspecified trimester, not applicable or unspecified: Secondary | ICD-10-CM | POA: Diagnosis present

## 2016-03-06 DIAGNOSIS — R140 Abdominal distension (gaseous): Secondary | ICD-10-CM

## 2016-03-06 DIAGNOSIS — O4290 Premature rupture of membranes, unspecified as to length of time between rupture and onset of labor, unspecified weeks of gestation: Secondary | ICD-10-CM | POA: Diagnosis present

## 2016-03-06 LAB — CBC
HEMATOCRIT: 36.4 % (ref 36.0–46.0)
HEMATOCRIT: 34.4 % — AB (ref 36.0–46.0)
HEMOGLOBIN: 12.4 g/dL (ref 12.0–15.0)
Hemoglobin: 11.9 g/dL — ABNORMAL LOW (ref 12.0–15.0)
MCH: 32.3 pg (ref 26.0–34.0)
MCH: 32.6 pg (ref 26.0–34.0)
MCHC: 34.1 g/dL (ref 30.0–36.0)
MCHC: 34.6 g/dL (ref 30.0–36.0)
MCV: 94.8 fL (ref 78.0–100.0)
MCV: 94.2 fL (ref 78.0–100.0)
PLATELETS: 96 10*3/uL — AB (ref 150–400)
Platelets: 120 10*3/uL — ABNORMAL LOW (ref 150–400)
RBC: 3.84 MIL/uL — ABNORMAL LOW (ref 3.87–5.11)
RBC: 3.65 MIL/uL — ABNORMAL LOW (ref 3.87–5.11)
RDW: 13.9 % (ref 11.5–15.5)
RDW: 13.8 % (ref 11.5–15.5)
WBC: 7 10*3/uL (ref 4.0–10.5)
WBC: 8.4 10*3/uL (ref 4.0–10.5)

## 2016-03-06 LAB — RPR: RPR Ser Ql: NONREACTIVE

## 2016-03-06 LAB — TYPE AND SCREEN
ABO/RH(D): O POS
Antibody Screen: NEGATIVE

## 2016-03-06 LAB — ABO/RH: ABO/RH(D): O POS

## 2016-03-06 MED ORDER — OXYTOCIN 40 UNITS IN LACTATED RINGERS INFUSION - SIMPLE MED
1.0000 m[IU]/min | INTRAVENOUS | Status: DC
  Administered 2016-03-06: 2 m[IU]/min via INTRAVENOUS

## 2016-03-06 MED ORDER — LACTATED RINGERS IV SOLN
INTRAVENOUS | Status: DC
  Administered 2016-03-06: 300 mL via INTRAUTERINE
  Administered 2016-03-07: 06:00:00 via INTRAUTERINE

## 2016-03-06 MED ORDER — LIDOCAINE HCL (PF) 1 % IJ SOLN
30.0000 mL | INTRAMUSCULAR | Status: DC | PRN
  Filled 2016-03-06: qty 30

## 2016-03-06 MED ORDER — ACETAMINOPHEN 325 MG PO TABS: 650.0000 mg | ORAL_TABLET | ORAL | Status: DC | PRN

## 2016-03-06 MED ORDER — OXYTOCIN 40 UNITS IN LACTATED RINGERS INFUSION - SIMPLE MED
2.5000 [IU]/h | INTRAVENOUS | Status: DC
  Filled 2016-03-06: qty 1000

## 2016-03-06 MED ORDER — OXYTOCIN BOLUS FROM INFUSION: 500.0000 mL | INTRAVENOUS | Status: DC

## 2016-03-06 MED ORDER — EPHEDRINE 5 MG/ML INJ: 10.0000 mg | INTRAVENOUS | Status: DC | PRN

## 2016-03-06 MED ORDER — SOD CITRATE-CITRIC ACID 500-334 MG/5ML PO SOLN
30.0000 mL | ORAL | Status: DC | PRN
  Administered 2016-03-07: 30 mL via ORAL
  Filled 2016-03-06 (×2): qty 15

## 2016-03-06 MED ORDER — FLEET ENEMA 7-19 GM/118ML RE ENEM: 1.0000 | ENEMA | RECTAL | Status: DC | PRN

## 2016-03-06 MED ORDER — DIPHENHYDRAMINE HCL 50 MG/ML IJ SOLN: 12.5000 mg | INTRAMUSCULAR | Status: DC | PRN

## 2016-03-06 MED ORDER — LACTATED RINGERS IV SOLN: INTRAVENOUS | Status: DC

## 2016-03-06 MED ORDER — PHENYLEPHRINE 40 MCG/ML (10ML) SYRINGE FOR IV PUSH (FOR BLOOD PRESSURE SUPPORT): 80.0000 ug | PREFILLED_SYRINGE | INTRAVENOUS | Status: DC | PRN

## 2016-03-06 MED ORDER — OXYCODONE-ACETAMINOPHEN 5-325 MG PO TABS: 2.0000 | ORAL_TABLET | ORAL | Status: DC | PRN

## 2016-03-06 MED ORDER — LIDOCAINE HCL (PF) 1 % IJ SOLN
INTRAMUSCULAR | Status: DC | PRN
  Administered 2016-03-06: 4 mL
  Administered 2016-03-06: 6 mL via EPIDURAL

## 2016-03-06 MED ORDER — FENTANYL 2.5 MCG/ML BUPIVACAINE 1/10 % EPIDURAL INFUSION (WH - ANES)
14.0000 mL/h | INTRAMUSCULAR | Status: DC | PRN
  Administered 2016-03-06: 14 mL/h via EPIDURAL
  Filled 2016-03-06: qty 125

## 2016-03-06 MED ORDER — FENTANYL CITRATE (PF) 100 MCG/2ML IJ SOLN: 100.0000 ug | INTRAMUSCULAR | Status: DC | PRN

## 2016-03-06 MED ORDER — LACTATED RINGERS IV SOLN: 500.0000 mL | Freq: Once | INTRAVENOUS | Status: DC

## 2016-03-06 MED ORDER — ONDANSETRON HCL 4 MG/2ML IJ SOLN: 4.0000 mg | Freq: Four times a day (QID) | INTRAMUSCULAR | Status: DC | PRN

## 2016-03-06 MED ORDER — PHENYLEPHRINE 40 MCG/ML (10ML) SYRINGE FOR IV PUSH (FOR BLOOD PRESSURE SUPPORT)
80.0000 ug | PREFILLED_SYRINGE | INTRAVENOUS | Status: DC | PRN
  Filled 2016-03-06: qty 10

## 2016-03-06 MED ORDER — LACTATED RINGERS IV SOLN
INTRAVENOUS | Status: DC
  Administered 2016-03-06 – 2016-03-07 (×5): via INTRAVENOUS

## 2016-03-06 MED ORDER — LACTATED RINGERS IV SOLN
500.0000 mL | INTRAVENOUS | Status: DC | PRN
  Administered 2016-03-06 (×4): 1000 mL via INTRAVENOUS
  Administered 2016-03-06: 500 mL via INTRAVENOUS

## 2016-03-06 MED ORDER — TERBUTALINE SULFATE 1 MG/ML IJ SOLN
0.2500 mg | Freq: Once | INTRAMUSCULAR | Status: AC | PRN
  Administered 2016-03-06: 0.25 mg via SUBCUTANEOUS
  Filled 2016-03-06: qty 1

## 2016-03-06 MED ORDER — OXYCODONE-ACETAMINOPHEN 5-325 MG PO TABS: 1.0000 | ORAL_TABLET | ORAL | Status: DC | PRN

## 2016-03-06 NOTE — Progress Notes (Signed)
Labor Progress Note Alejandra Peterson is a 40 y.o. G4P0030 at 4720w3d admitted for PROM S: reports feeling contraction. No other issues.  O:  BP 116/81 mmHg  Pulse 68  Temp(Src) 98.4 F (36.9 C) (Oral)  Resp 18  Ht 5' 7.32" (1.71 m)  Wt 185 lb 3 oz (84 kg)  BMI 28.73 kg/m2  LMP 06/04/2015 EFM: 130/mod var/variable and early decels  CVE: Dilation: 1.5 Effacement (%): 70 Station: -2 Presentation: Vertex Exam by:: Ferne CoeS. Earl RNC   A&P: 40 y.o. G4P0030 4420w3d admitted for PROM #Labor: IUPC placed at 10:30 am. Started Pit 2x2  #Pain: as needed fentanyl and NO.  #FWB: CAT-2. Moderate variability. No late decels #GBS: Neg  Alejandra Herculesaye T Calie Buttrey, MD 10:34 AM  Used hospital interpretor for this encounter

## 2016-03-06 NOTE — Progress Notes (Signed)
I assisted Rn with explanation of care plan.  Eda H Royal  Interpreter.

## 2016-03-06 NOTE — Progress Notes (Signed)
Labor Progress Note Alejandra Peterson is a 40 y.o. G4P0030 at 6766w3d presented for PROM S: no complaint. Contraction pains better after pitocin was stopped. She is off pitocin since 1300 due recurrent variable decelerations with contraction.   O:  BP 127/71 mmHg  Pulse 71  Temp(Src) 98.1 F (36.7 C) (Oral)  Resp 18  Ht 5' 7.32" (1.71 m)  Wt 185 lb 3 oz (84 kg)  BMI 28.73 kg/m2  LMP 06/04/2015 EFM: 130/mod var/variable decels  CVE: Dilation: 1.5 Effacement (%): 70 Station: -2 Presentation: Vertex Exam by:: Dr. Alanda SlimGonfa   A&P: 40 y.o. G4P0030 5966w3d admitted for PROM #Labor: off pitocin for the last two hours due to recurrent decels. Contraction has spaced out. Will resume pitocin again #Pain: not in a lot of pain for now. She will decide about epidural when pain is stronger.  #FWB: CAT-2. She continued to have variable decels with contractions but baby's heart return to baseline quick and has good moderate variability as well  #GBS: neg.  Almon Herculesaye T Gonfa, MD 3:24 PM

## 2016-03-06 NOTE — Progress Notes (Signed)
LABOR PROGRESS NOTE  Lively Adamu Borico is a 40 y.o. G4P0030 at 2559w3d  admitted for SROM on 6/20@0500 .  Subjective: Pt required pitocin break today from 1315-1600 due to persistent variable decels.  Now is on pitocin and IUPC was placed to monitor contractions and provide amnioinfusion.  Pt remains comfortable with epidural in place.    Objective: BP 137/75 mmHg  Pulse 67  Temp(Src) 98.5 F (36.9 C) (Oral)  Resp 18  Ht 5' 7.32" (1.71 m)  Wt 185 lb 3 oz (84 kg)  BMI 28.73 kg/m2  SpO2 99%  LMP 06/04/2015 or  Filed Vitals:   03/06/16 1919 03/06/16 1931 03/06/16 2001 03/06/16 2017  BP:  130/80 145/82 137/75  Pulse:  67 66 67  Temp: 98.5 F (36.9 C)     TempSrc: Oral     Resp:  18 18 18   Height:      Weight:      SpO2:       Last check was at 1837 Dilation: 3 Effacement (%): 70 Station: -2 Presentation: Vertex Exam by:: H. Koran RNC  Labs: Lab Results  Component Value Date   WBC 7.0 03/06/2016   HGB 12.4 03/06/2016   HCT 36.4 03/06/2016   MCV 94.8 03/06/2016   PLT 120* 03/06/2016    Patient Active Problem List   Diagnosis Date Noted  . ROM (rupture of membranes), premature 03/06/2016  . Fetal arrhythmia affecting pregnancy, antepartum 02/15/2016  . Thrombocytopenia during pregnancy (HCC) 01/19/2016  . Supervision of low-risk pregnancy 01/18/2016    Assessment / Plan: 40 y.o. G4P0030 at 10259w3d here for SROM on 6/20@0500   Labor: Committed to augmentation with pitocin, will recheck cervix at 4-6hrs since last and if no change consider failure to progress and possible c-s Fetal Wellbeing:  Cat II (persistent variable decels) Pain Control:  Epidural Anticipated MOD:  NSVD vs c-section  Olena LeatherwoodKelly M Cole Klugh, MD 03/06/2016, 9:30 PM

## 2016-03-06 NOTE — Progress Notes (Signed)
Notified of pt arrival in MAU, SROM with moderate meconium, and vaginal exam. Will put in orders to admit to labor and delivery

## 2016-03-06 NOTE — Anesthesia Pain Management Evaluation Note (Signed)
  [facility] Pain Management Visit Note  Patient: Alejandra Peterson, 40 y.o., female  "Hello I am a member of the anesthesia team at [facility]. We have an anesthesia team available at all times to provide care throughout the hospital, including epidural management and anesthesia for C-section. I don't know your plan for the delivery whether it a natural birth, water birth, IV sedation, nitrous supplementation, doula or epidural, but we want to meet your pain goals."   1.Was your pain managed to your expectations on prior hospitalizations?   No prior hospitalizations  2.What is your expectation for pain management during this hospitalization?     Patient is  unsure about what she is going to do about pain relief. She will let us know.  3.How can we help you reach that goal?  Unsure  Record the patient's initial score and the patient's pain goal.   Pain: 6  Pain Goal: 6 [facility] wants you to be able to say your pain was always managed very well.  Ayansh Feutz 03/06/2016

## 2016-03-06 NOTE — MAU Note (Signed)
Pt presents stating her water broke at 0500 with clear fluid. Denies pain. Denies bleeding. Reports good fetal movement.

## 2016-03-06 NOTE — Progress Notes (Signed)
LABOR PROGRESS NOTE  Alejandra Peterson is a 40 y.o. G4P0030 at [redacted]w[redacted]d  admitted for prom  Subjective: Moderate pain  Objective: BP 129/77 mmHg  Pulse 58  Temp(Src) 98.3 F (36.8 C) (Oral)  Resp 18  Ht 5' 7.32" (1.71 m)  Wt 185 lb 3 oz (84 kg)  BMI 28.73 kg/m2  LMP 06/04/2015 or  Filed Vitals:   03/06/16 1130 03/06/16 1200 03/06/16 1230 03/06/16 1232  BP: 118/59 127/64 129/77 129/77  Pulse: 62 73 58 58  Temp:   98.3 F (36.8 C)   TempSrc:   Oral   Resp: 18 18 18    Height:      Weight:        140/mod/-a/variables with each contraction  Dilation: 1.5 Effacement (%): 70 Station: -2 Presentation: Vertex Exam by:: Dr. Alanda SlimGonfa  Labs: Lab Results  Component Value Date   WBC 7.0 03/06/2016   HGB 12.4 03/06/2016   HCT 36.4 03/06/2016   MCV 94.8 03/06/2016   PLT 120* 03/06/2016    Patient Active Problem List   Diagnosis Date Noted  . ROM (rupture of membranes), premature 03/06/2016  . Fetal arrhythmia affecting pregnancy, antepartum 02/15/2016  . Thrombocytopenia during pregnancy (HCC) 01/19/2016  . Supervision of low-risk pregnancy 01/18/2016    Assessment / Plan: 40 y.o. G4P0030 at 499w3d here for prom  Labor: now on pitocin as no cervical change with infrequent contractions. Recurrent variables with contractions; iupc in place and amnioinfusion is running. Good return to baseline with moderate variability, will proceed with pitocin uptitration for now, closely monitoring fetal status Fetal Wellbeing:  Cat 2 Pain Control:  Eventual epidural Anticipated MOD:  Hopeful for vaginal delivery  Silvano BilisNoah B Math Brazie, MD 03/06/2016, 12:53 PM

## 2016-03-06 NOTE — H&P (Signed)
LABOR AND DELIVERY ADMISSION HISTORY AND PHYSICAL NOTE  Alejandra Peterson is a 40 y.o. female G4P0030 with IUP at 2135w3d by LMP presenting for SROM@0500  today, contractions had started on 0300.  Prenatal risk factors: late PNC in 3rd trimester [place] (did [place] complete PNC there), thrombocytopenia, fetal arrhythmia, AMA, LGA She reports positive fetal movement. She denies leakage of fluid or vaginal bleeding.  Prenatal History/Complications:  Past Medical History: Past Medical History  Diagnosis Date  . Medical history non-contributory     Past Surgical History: Past Surgical History  Procedure Laterality Date  . No past surgeries      Obstetrical History: OB History    Gravida Para Term Preterm AB TAB SAB Ectopic Multiple Living   4 0 0 0 3 0 3 0 0 0       Social History: Social History   Social History  . Marital Status: Single    Spouse Name: N/A  . Number of Children: N/A  . Years of Education: N/A   Social History Main Topics  . Smoking status: Never Smoker   . Smokeless tobacco: None  . Alcohol Use: None  . Drug Use: None  . Sexual Activity: Not Currently    Birth Control/ Protection: None   Other Topics Concern  . None   Social History Narrative    Family History: History reviewed. No pertinent family history.  Allergies: No Known Allergies  Prescriptions prior to admission  Medication Sig Dispense Refill Last Dose  . Prenatal Multivit-Min-Fe-FA (PRENATAL VITAMINS) 0.8 MG tablet Take 1 tablet by mouth daily. 30 tablet 12 Taking     Review of Systems   All systems reviewed and negative except as stated in HPI  Last menstrual period 06/04/2015. General appearance: alert and cooperative Lungs: clear to auscultation bilaterally Heart: regular rate and rhythm Abdomen: soft, non-tender; bowel sounds normal Extremities: No calf swelling or tenderness Presentation: cephalic Fetal monitoring: Baseline  Uterine  activity:  Dilation: 1 Effacement (%): 70 Station: -2 Exam by:: Samson Frederic. Callaway RN   Prenatal labs: ABO, Rh: O/POS/-- (05/03 1111) Antibody: NEG (05/03 1111) Rubella: Immune RPR: NON REAC (05/03 1111)  HBsAg: NEGATIVE (05/03 1111)  HIV: NONREACTIVE (05/03 1111)  GBS:   Negative 1 hr Glucola: 98 Genetic screening:  Late to care Anatomy US: 01/30/16-SIUP, cephalic, anterior placenta, 2772g (6lb 2 oz) 68%tile @ 35W, EDC 03/02/16, normal anatomy  Prenatal Transfer Tool  Maternal Diabetes: No Genetic Screening: Declined Maternal Ultrasounds/Referrals: Normal Fetal Ultrasounds or other Referrals:  None Maternal Substance Abuse:  No Significant Maternal Medications:  None Significant Maternal Lab Results: None  No results found for this or any previous visit (from the past 24 hour(s)).  Patient Active Problem List   Diagnosis Date Noted  . Fetal arrhythmia affecting pregnancy, antepartum 02/15/2016  . Thrombocytopenia during pregnancy (HCC) 01/19/2016  . Supervision of low-risk pregnancy 01/18/2016    Assessment: Alejandra Peterson is a 40 y.o. G4P0030 at 5335w3d here for SROM at 0500.  #Labor:Early, SROM (with meconium), will monitor for 2 hours and repeat cervical check to determine if augmentation indicated #Pain: Fentanyl, NO #FWB: Cat II (variable decels noted) #ID:  GBS negative #MOF: Breast/Bottle #MOC:Undecided #Circ:  Female, undecided  Olena LeatherwoodKelly M Aguilar 03/06/2016, 6:43 AM  I was present for the exam and agree with above.  [place] Diondre Pulis, CNM 03/06/2016 9:18 AM

## 2016-03-06 NOTE — Anesthesia Procedure Notes (Signed)

## 2016-03-06 NOTE — Anesthesia Preprocedure Evaluation (Signed)
Anesthesia Evaluation  Patient identified by MRN, date of birth, ID band Patient awake    Reviewed: Allergy & Precautions, H&P , Patient's Chart, lab work & pertinent test results  Airway Mallampati: II  TM Distance: >3 FB Neck ROM: full    Dental  (+) Teeth Intact   Pulmonary    breath sounds clear to auscultation       Cardiovascular  Rhythm:regular Rate:Normal     Neuro/Psych    GI/Hepatic   Endo/Other    Renal/GU      Musculoskeletal   Abdominal   Peds  Hematology   Anesthesia Other Findings Late prenatal care thrombocytopenia      Reproductive/Obstetrics (+) Pregnancy                             Anesthesia Physical Anesthesia Plan  ASA: II  Anesthesia Plan: Epidural   Post-op Pain Management:    Induction:   Airway Management Planned:   Additional Equipment:   Intra-op Plan:   Post-operative Plan:   Informed Consent: I have reviewed the patients History and Physical, chart, labs and discussed the procedure including the risks, benefits and alternatives for the proposed anesthesia with the patient or authorized representative who has indicated his/her understanding and acceptance.   Dental Advisory Given  Plan Discussed with:   Anesthesia Plan Comments: (Labs checked- platelets confirmed with RN in room. Fetal heart tracing, per RN, reported to be stable enough for sitting procedure. Discussed epidural, and patient consents to the procedure:  included risk of possible headache,backache, failed block, allergic reaction, and nerve injury. This patient was asked if she had any questions or concerns before the procedure started.)        Anesthesia Quick Evaluation

## 2016-03-07 ENCOUNTER — Encounter (HOSPITAL_COMMUNITY)
Admission: AD | Disposition: A | Discharge status: Home / Self Care | Payer: Self-pay | Source: Ambulatory Visit | Attending: Family Medicine

## 2016-03-07 ENCOUNTER — Encounter (HOSPITAL_COMMUNITY): Payer: Self-pay | Admitting: Anesthesiology

## 2016-03-07 DIAGNOSIS — Z3A39 39 weeks gestation of pregnancy: Secondary | ICD-10-CM

## 2016-03-07 DIAGNOSIS — O9912 Other diseases of the blood and blood-forming organs and certain disorders involving the immune mechanism complicating childbirth: Secondary | ICD-10-CM

## 2016-03-07 DIAGNOSIS — O3663X0 Maternal care for excessive fetal growth, third trimester, not applicable or unspecified: Secondary | ICD-10-CM

## 2016-03-07 DIAGNOSIS — D696 Thrombocytopenia, unspecified: Secondary | ICD-10-CM

## 2016-03-07 LAB — CBC
HEMATOCRIT: 39.6 % (ref 36.0–46.0)
HEMOGLOBIN: 13.4 g/dL (ref 12.0–15.0)
MCH: 32.7 pg (ref 26.0–34.0)
MCHC: 33.8 g/dL (ref 30.0–36.0)
MCV: 96.6 fL (ref 78.0–100.0)
Platelets: 81 10*3/uL — ABNORMAL LOW (ref 150–400)
RBC: 4.1 MIL/uL (ref 3.87–5.11)
RDW: 14 % (ref 11.5–15.5)
WBC: 6.3 10*3/uL (ref 4.0–10.5)

## 2016-03-07 SURGERY — Surgical Case
Anesthesia: Epidural

## 2016-03-07 MED ORDER — DIPHENHYDRAMINE HCL 50 MG/ML IJ SOLN: 12.5000 mg | INTRAMUSCULAR | Status: DC | PRN

## 2016-03-07 MED ORDER — SCOPOLAMINE 1 MG/3DAYS TD PT72
MEDICATED_PATCH | TRANSDERMAL | Status: AC
  Filled 2016-03-07: qty 1

## 2016-03-07 MED ORDER — OXYTOCIN 10 UNIT/ML IJ SOLN
INTRAMUSCULAR | Status: DC | PRN
  Administered 2016-03-07: 40 [IU] via INTRAMUSCULAR

## 2016-03-07 MED ORDER — MORPHINE SULFATE (PF) 0.5 MG/ML IJ SOLN
INTRAMUSCULAR | Status: DC | PRN
  Administered 2016-03-07: 3.5 mg via EPIDURAL
  Administered 2016-03-07: 1.5 mg via INTRAVENOUS

## 2016-03-07 MED ORDER — SODIUM CHLORIDE 0.9% FLUSH: 3.0000 mL | INTRAVENOUS | Status: DC | PRN

## 2016-03-07 MED ORDER — TETANUS-DIPHTH-ACELL PERTUSSIS 5-2.5-18.5 LF-MCG/0.5 IM SUSP
0.5000 mL | Freq: Once | INTRAMUSCULAR | Status: DC
  Filled 2016-03-07: qty 0.5

## 2016-03-07 MED ORDER — OXYTOCIN 10 UNIT/ML IJ SOLN
INTRAMUSCULAR | Status: AC
  Filled 2016-03-07: qty 4

## 2016-03-07 MED ORDER — FENTANYL CITRATE (PF) 100 MCG/2ML IJ SOLN
INTRAMUSCULAR | Status: AC
  Filled 2016-03-07: qty 2

## 2016-03-07 MED ORDER — MIDAZOLAM HCL 2 MG/2ML IJ SOLN
INTRAMUSCULAR | Status: AC
  Filled 2016-03-07: qty 2

## 2016-03-07 MED ORDER — MORPHINE SULFATE (PF) 0.5 MG/ML IJ SOLN
INTRAMUSCULAR | Status: AC
  Filled 2016-03-07: qty 10

## 2016-03-07 MED ORDER — ONDANSETRON HCL 4 MG/2ML IJ SOLN
INTRAMUSCULAR | Status: DC | PRN
  Administered 2016-03-07: 4 mg via INTRAVENOUS

## 2016-03-07 MED ORDER — TERBUTALINE SULFATE 1 MG/ML IJ SOLN
INTRAMUSCULAR | Status: AC
  Filled 2016-03-07: qty 1

## 2016-03-07 MED ORDER — MAGNESIUM SULFATE BOLUS VIA INFUSION
6.0000 g | Freq: Once | INTRAVENOUS | Status: AC
  Administered 2016-03-07: 6 g via INTRAVENOUS
  Filled 2016-03-07: qty 500

## 2016-03-07 MED ORDER — ONDANSETRON HCL 4 MG/2ML IJ SOLN: 4.0000 mg | Freq: Three times a day (TID) | INTRAMUSCULAR | Status: DC | PRN

## 2016-03-07 MED ORDER — CEFAZOLIN SODIUM-DEXTROSE 2-4 GM/100ML-% IV SOLN
INTRAVENOUS | Status: AC
  Filled 2016-03-07: qty 100

## 2016-03-07 MED ORDER — WITCH HAZEL-GLYCERIN EX PADS: 1.0000 "application " | MEDICATED_PAD | CUTANEOUS | Status: DC | PRN

## 2016-03-07 MED ORDER — MEPERIDINE HCL 25 MG/ML IJ SOLN
INTRAMUSCULAR | Status: DC | PRN
  Administered 2016-03-07 (×2): 12.5 mg via INTRAVENOUS

## 2016-03-07 MED ORDER — PRENATAL MULTIVITAMIN CH
1.0000 | ORAL_TABLET | Freq: Every day | ORAL | Status: DC
  Administered 2016-03-07 – 2016-03-14 (×5): 1 via ORAL
  Filled 2016-03-07 (×6): qty 1

## 2016-03-07 MED ORDER — LACTATED RINGERS IV SOLN
INTRAVENOUS | Status: DC
  Administered 2016-03-07 – 2016-03-12 (×4): via INTRAVENOUS

## 2016-03-07 MED ORDER — LIDOCAINE-EPINEPHRINE (PF) 2 %-1:200000 IJ SOLN
INTRAMUSCULAR | Status: AC
  Filled 2016-03-07: qty 20

## 2016-03-07 MED ORDER — GENTAMICIN SULFATE 40 MG/ML IJ SOLN
5.0000 mg/kg | Freq: Once | INTRAVENOUS | Status: AC
  Administered 2016-03-07: 360 mg via INTRAVENOUS
  Filled 2016-03-07: qty 9

## 2016-03-07 MED ORDER — SIMETHICONE 80 MG PO CHEW
80.0000 mg | CHEWABLE_TABLET | Freq: Three times a day (TID) | ORAL | Status: DC
  Administered 2016-03-07 – 2016-03-12 (×9): 80 mg via ORAL
  Filled 2016-03-07 (×9): qty 1

## 2016-03-07 MED ORDER — TERBUTALINE SULFATE 1 MG/ML IJ SOLN
0.2500 mg | Freq: Once | INTRAMUSCULAR | Status: AC
  Administered 2016-03-07: 0.25 mg via SUBCUTANEOUS

## 2016-03-07 MED ORDER — NALOXONE HCL 0.4 MG/ML IJ SOLN: 0.4000 mg | INTRAMUSCULAR | Status: DC | PRN

## 2016-03-07 MED ORDER — SENNOSIDES-DOCUSATE SODIUM 8.6-50 MG PO TABS
2.0000 | ORAL_TABLET | ORAL | Status: DC
  Administered 2016-03-08 – 2016-03-09 (×3): 2 via ORAL
  Filled 2016-03-07 (×4): qty 2

## 2016-03-07 MED ORDER — PHENYLEPHRINE 40 MCG/ML (10ML) SYRINGE FOR IV PUSH (FOR BLOOD PRESSURE SUPPORT)
PREFILLED_SYRINGE | INTRAVENOUS | Status: AC
  Filled 2016-03-07: qty 10

## 2016-03-07 MED ORDER — SIMETHICONE 80 MG PO CHEW
80.0000 mg | CHEWABLE_TABLET | ORAL | Status: DC
  Administered 2016-03-08 – 2016-03-12 (×5): 80 mg via ORAL
  Filled 2016-03-07 (×6): qty 1

## 2016-03-07 MED ORDER — MEPERIDINE HCL 25 MG/ML IJ SOLN: 6.2500 mg | INTRAMUSCULAR | Status: DC | PRN

## 2016-03-07 MED ORDER — SOD CITRATE-CITRIC ACID 500-334 MG/5ML PO SOLN
30.0000 mL | ORAL | Status: DC
  Filled 2016-03-07: qty 30

## 2016-03-07 MED ORDER — SCOPOLAMINE 1 MG/3DAYS TD PT72
MEDICATED_PATCH | TRANSDERMAL | Status: DC | PRN
  Administered 2016-03-07: 1 via TRANSDERMAL

## 2016-03-07 MED ORDER — NALOXONE HCL 2 MG/2ML IJ SOSY
1.0000 ug/kg/h | PREFILLED_SYRINGE | INTRAVENOUS | Status: DC | PRN
  Filled 2016-03-07: qty 2

## 2016-03-07 MED ORDER — OXYCODONE HCL 5 MG PO TABS
5.0000 mg | ORAL_TABLET | ORAL | Status: DC | PRN
  Administered 2016-03-08 – 2016-03-12 (×6): 5 mg via ORAL
  Filled 2016-03-07 (×9): qty 1

## 2016-03-07 MED ORDER — DIPHENHYDRAMINE HCL 25 MG PO CAPS
25.0000 mg | ORAL_CAPSULE | ORAL | Status: DC | PRN
  Filled 2016-03-07: qty 1

## 2016-03-07 MED ORDER — CEFAZOLIN SODIUM-DEXTROSE 2-3 GM-% IV SOLR
INTRAVENOUS | Status: DC | PRN
  Administered 2016-03-07: 2 g via INTRAVENOUS

## 2016-03-07 MED ORDER — NALBUPHINE HCL 10 MG/ML IJ SOLN: 5.0000 mg | Freq: Once | INTRAMUSCULAR | Status: DC | PRN

## 2016-03-07 MED ORDER — OXYCODONE HCL 5 MG PO TABS
10.0000 mg | ORAL_TABLET | ORAL | Status: DC | PRN
  Administered 2016-03-08 – 2016-03-12 (×10): 10 mg via ORAL
  Administered 2016-03-12: 5 mg via ORAL
  Administered 2016-03-12 – 2016-03-13 (×4): 10 mg via ORAL
  Administered 2016-03-14: 5 mg via ORAL
  Filled 2016-03-07 (×14): qty 2

## 2016-03-07 MED ORDER — FENTANYL CITRATE (PF) 100 MCG/2ML IJ SOLN
INTRAMUSCULAR | Status: DC | PRN
  Administered 2016-03-07: 100 ug via EPIDURAL
  Administered 2016-03-07: 100 ug via INTRAVENOUS

## 2016-03-07 MED ORDER — LIDOCAINE-EPINEPHRINE (PF) 2 %-1:200000 IJ SOLN
INTRAMUSCULAR | Status: DC | PRN
  Administered 2016-03-07: 2 mL via EPIDURAL
  Administered 2016-03-07 (×2): 4 mL via EPIDURAL

## 2016-03-07 MED ORDER — CLINDAMYCIN PHOSPHATE 900 MG/50ML IV SOLN
900.0000 mg | Freq: Three times a day (TID) | INTRAVENOUS | Status: AC
  Administered 2016-03-07 – 2016-03-08 (×3): 900 mg via INTRAVENOUS
  Filled 2016-03-07 (×3): qty 50

## 2016-03-07 MED ORDER — NALBUPHINE HCL 10 MG/ML IJ SOLN: 5.0000 mg | INTRAMUSCULAR | Status: DC | PRN

## 2016-03-07 MED ORDER — DIPHENHYDRAMINE HCL 25 MG PO CAPS: 25.0000 mg | ORAL_CAPSULE | Freq: Four times a day (QID) | ORAL | Status: DC | PRN

## 2016-03-07 MED ORDER — LACTATED RINGERS IV SOLN
2.0000 g/h | INTRAVENOUS | Status: DC
  Administered 2016-03-08: 2 g/h via INTRAVENOUS
  Filled 2016-03-07: qty 80

## 2016-03-07 MED ORDER — MAGNESIUM SULFATE 50 % IJ SOLN
2.0000 g/h | INTRAVENOUS | Status: DC
  Administered 2016-03-07: 2 g/h via INTRAVENOUS
  Filled 2016-03-07: qty 80

## 2016-03-07 MED ORDER — ZOLPIDEM TARTRATE 5 MG PO TABS: 5.0000 mg | ORAL_TABLET | Freq: Every evening | ORAL | Status: DC | PRN

## 2016-03-07 MED ORDER — ONDANSETRON HCL 4 MG/2ML IJ SOLN
INTRAMUSCULAR | Status: AC
  Filled 2016-03-07: qty 2

## 2016-03-07 MED ORDER — SIMETHICONE 80 MG PO CHEW
80.0000 mg | CHEWABLE_TABLET | ORAL | Status: DC | PRN
  Administered 2016-03-09 – 2016-03-10 (×2): 80 mg via ORAL
  Filled 2016-03-07 (×3): qty 1

## 2016-03-07 MED ORDER — DIBUCAINE 1 % RE OINT: 1.0000 "application " | TOPICAL_OINTMENT | RECTAL | Status: DC | PRN

## 2016-03-07 MED ORDER — HYDROMORPHONE HCL 1 MG/ML IJ SOLN: 0.2500 mg | INTRAMUSCULAR | Status: DC | PRN

## 2016-03-07 MED ORDER — SCOPOLAMINE 1 MG/3DAYS TD PT72
1.0000 | MEDICATED_PATCH | Freq: Once | TRANSDERMAL | Status: AC
  Administered 2016-03-07: 1.5 mg via TRANSDERMAL

## 2016-03-07 MED ORDER — ACETAMINOPHEN 500 MG PO TABS
1000.0000 mg | ORAL_TABLET | Freq: Four times a day (QID) | ORAL | Status: AC
  Administered 2016-03-07 – 2016-03-08 (×3): 1000 mg via ORAL
  Filled 2016-03-07 (×3): qty 2

## 2016-03-07 MED ORDER — MENTHOL 3 MG MT LOZG
1.0000 | LOZENGE | OROMUCOSAL | Status: DC | PRN
  Filled 2016-03-07: qty 9

## 2016-03-07 MED ORDER — SODIUM CHLORIDE 0.9 % IV SOLN
2.0000 g | Freq: Four times a day (QID) | INTRAVENOUS | Status: AC
  Administered 2016-03-07 – 2016-03-08 (×4): 2 g via INTRAVENOUS
  Filled 2016-03-07 (×4): qty 2000

## 2016-03-07 MED ORDER — MIDAZOLAM HCL 5 MG/5ML IJ SOLN
INTRAMUSCULAR | Status: DC | PRN
  Administered 2016-03-07: 1 mg via INTRAVENOUS

## 2016-03-07 MED ORDER — ACETAMINOPHEN 325 MG PO TABS
650.0000 mg | ORAL_TABLET | ORAL | Status: DC | PRN
  Administered 2016-03-08 – 2016-03-10 (×3): 650 mg via ORAL
  Filled 2016-03-07 (×3): qty 2

## 2016-03-07 MED ORDER — CEFAZOLIN SODIUM-DEXTROSE 2-4 GM/100ML-% IV SOLN
2.0000 g | INTRAVENOUS | Status: DC
  Filled 2016-03-07 (×2): qty 100

## 2016-03-07 MED ORDER — OXYTOCIN 40 UNITS IN LACTATED RINGERS INFUSION - SIMPLE MED: 2.5000 [IU]/h | INTRAVENOUS | Status: AC

## 2016-03-07 MED ORDER — MEPERIDINE HCL 25 MG/ML IJ SOLN
INTRAMUSCULAR | Status: AC
  Filled 2016-03-07: qty 1

## 2016-03-07 MED ORDER — COCONUT OIL OIL: 1.0000 "application " | TOPICAL_OIL | Status: DC | PRN

## 2016-03-07 SURGICAL SUPPLY — 29 items
BENZOIN TINCTURE PRP APPL 2/3 (GAUZE/BANDAGES/DRESSINGS) ×2 IMPLANT
CLAMP CORD UMBIL (MISCELLANEOUS) IMPLANT
CLOSURE STERI STRIP 1/2 X4 (GAUZE/BANDAGES/DRESSINGS) ×2 IMPLANT
CLOTH BEACON ORANGE TIMEOUT ST (SAFETY) ×2 IMPLANT
DRSG OPSITE POSTOP 4X10 (GAUZE/BANDAGES/DRESSINGS) ×2 IMPLANT
DURAPREP 26ML APPLICATOR (WOUND CARE) ×2 IMPLANT
ELECT REM PT RETURN 9FT ADLT (ELECTROSURGICAL) ×2
ELECTRODE REM PT RTRN 9FT ADLT (ELECTROSURGICAL) ×1 IMPLANT
EXTRACTOR VACUUM M CUP 4 TUBE (SUCTIONS) IMPLANT
GLOVE BIOGEL PI IND STRL 7.0 (GLOVE) ×2 IMPLANT
GLOVE BIOGEL PI INDICATOR 7.0 (GLOVE) ×2
GLOVE ECLIPSE 7.0 STRL STRAW (GLOVE) ×4 IMPLANT
GOWN STRL REUS W/TWL LRG LVL3 (GOWN DISPOSABLE) ×4 IMPLANT
KIT ABG SYR 3ML LUER SLIP (SYRINGE) IMPLANT
NEEDLE HYPO 22GX1.5 SAFETY (NEEDLE) ×2 IMPLANT
NEEDLE HYPO 25X5/8 SAFETYGLIDE (NEEDLE) IMPLANT
NS IRRIG 1000ML POUR BTL (IV SOLUTION) ×2 IMPLANT
PACK C SECTION WH (CUSTOM PROCEDURE TRAY) ×2 IMPLANT
PAD ABD 7.5X8 STRL (GAUZE/BANDAGES/DRESSINGS) ×2 IMPLANT
PAD OB MATERNITY 4.3X12.25 (PERSONAL CARE ITEMS) ×2 IMPLANT
PENCIL SMOKE EVAC W/HOLSTER (ELECTROSURGICAL) ×2 IMPLANT
RTRCTR C-SECT PINK 25CM LRG (MISCELLANEOUS) ×2 IMPLANT
SPONGE GAUZE 4X4 12PLY STER LF (GAUZE/BANDAGES/DRESSINGS) ×4 IMPLANT
SUT VIC AB 0 CTX 36 (SUTURE) ×3
SUT VIC AB 0 CTX36XBRD ANBCTRL (SUTURE) ×3 IMPLANT
SUT VIC AB 4-0 KS 27 (SUTURE) ×2 IMPLANT
SYR 30ML LL (SYRINGE) ×2 IMPLANT
TOWEL OR 17X24 6PK STRL BLUE (TOWEL DISPOSABLE) ×2 IMPLANT
TRAY FOLEY CATH SILVER 14FR (SET/KITS/TRAYS/PACK) ×2 IMPLANT

## 2016-03-07 NOTE — Progress Notes (Signed)
I check pt needs I ordered meals, I assisted RN from NICU with some information about the Unit and baby, by Orlan LeavensViria Alvarez Spanish Interpreter.

## 2016-03-07 NOTE — Progress Notes (Signed)
I assisted Anesthesia with questions.  Eda H Royal Interpreter.

## 2016-03-07 NOTE — Progress Notes (Signed)
LABOR PROGRESS NOTE  Alejandra Peterson is a 40 y.o. G4P0030 at 5854w3d  admitted for prom  Subjective: No pain s/p epidural. No fever  Objective: BP 130/69 mmHg  Pulse 70  Temp(Src) 98.2 F (36.8 C) (Oral)  Resp 18  Ht 5' 7.32" (1.71 m)  Wt 185 lb 3 oz (84 kg)  BMI 28.73 kg/m2  SpO2 100%  LMP 06/04/2015 or  Filed Vitals:   03/07/16 0055 03/07/16 0100 03/07/16 0101 03/07/16 0131  BP:   149/122 130/69  Pulse: 66 74  70  Temp:      TempSrc:      Resp:   18 18  Height:      Weight:      SpO2: 100% 100%      140/mod/-a/recurrent late decels 9.5/100/0  Labs: Lab Results  Component Value Date   WBC 8.4 03/06/2016   HGB 11.9* 03/06/2016   HCT 34.4* 03/06/2016   MCV 94.2 03/06/2016   PLT 96* 03/06/2016    Patient Active Problem List   Diagnosis Date Noted  . ROM (rupture of membranes), premature 03/06/2016  . Fetal arrhythmia affecting pregnancy, antepartum 02/15/2016  . Thrombocytopenia during pregnancy (HCC) 01/19/2016  . Supervision of low-risk pregnancy 01/18/2016    Assessment / Plan: 40 y.o. G4P0030 at 2454w3d here for prom  Labor: now s/p approximately 1 hour pitocin break for recurrent late decels. fhts had improved, but began contracting again q 4-5 min, and just now with prolonged decel, still with a rim of cervix. o2 going, bolus going, will give terbutaline, hopefully full dilation soon and can begin to push Fetal Wellbeing:  Cat 2 Pain Control:  epidural Anticipated MOD:  Hopeful for vaginal delivery  Silvano BilisNoah B Rodgerick Gilliand, MD 03/07/2016, 2:21 AM

## 2016-03-07 NOTE — Progress Notes (Signed)
Late entry:  On 03/06/16 translator at bedside for admission questions and let pt know plan of care.  Throughout the day pt initiated conversations with staff in [place] and answered open ended questions appropriately.    Estanislado EmmsAshley Schwarz, RN

## 2016-03-07 NOTE — Progress Notes (Signed)
I was in the PACU with the patient this morning. Also assisted Academic librarianDebbie RN with explanation of care plan. Eda H Royal Interpreter.

## 2016-03-07 NOTE — Progress Notes (Signed)
Patient ID: [name], female   DOB: 1976-06-27, 40 y.o.   MRN: [id number] Patient with MSF and variables all day with IUPC and amnioinfusion. Pitocin on and off. Finally complete but ROT and 0 station. There was a 6 min. Bradycardia and Pitocin back off. Some loss of variability noted, so pushing started, but ineffective. After no descent x 30 min. Of pushing, decided to rest and place on peanut. Re-eval in 30-45 mins. FHR in the 145's with improved variability. Pitocin restarted. If no descent will need to proceed with abdominal delivery.

## 2016-03-07 NOTE — Progress Notes (Signed)
This note also relates to the following rows which could not be included: BP - Cannot attach notes to unvalidated device data Pulse Rate - Cannot attach notes to unvalidated device data Resp - Cannot attach notes to unvalidated device data SpO2 - Cannot attach notes to unvalidated device data   Dr Jolayne Pantheronstant and Dr. Malen GauzeFoster notified of pt VS: plts 81,000, temp 100.7, transient elevated respirations and B/P.  May transfer to floor.

## 2016-03-07 NOTE — Progress Notes (Signed)
Order received to start MgSO4

## 2016-03-07 NOTE — Transfer of Care (Signed)
Immediate Anesthesia Transfer of Care Note  Patient: Alejandra Peterson  Procedure(s) Performed: Procedure(s): CESAREAN SECTION (N/A)  Patient Location: PACU  Anesthesia Type:Epidural  Level of Consciousness: awake, alert  and oriented  Airway & Oxygen Therapy: Patient Spontanous Breathing  Post-op Assessment: Report given to RN and Post -op Vital signs reviewed and stable  Post vital signs: Reviewed and stable  Last Vitals:  Filed Vitals:   03/07/16 0201 03/07/16 0331  BP:    Pulse:    Temp: 37 C 37.2 C  Resp:      Last Pain:  Filed Vitals:   03/07/16 0649  PainSc: Asleep      Patients Stated Pain Goal: 6 (03/06/16 0717)  Complications: No apparent anesthesia complications

## 2016-03-07 NOTE — Anesthesia Postprocedure Evaluation (Signed)
Anesthesia Post Note  Patient: Alejandra Peterson  Procedure(s) Performed: Procedure(s) (LRB): CESAREAN SECTION (N/A)  Patient location during evaluation: PACU Anesthesia Type: Epidural Level of consciousness: awake and alert and oriented Pain management: pain level controlled Vital Signs Assessment: post-procedure vital signs reviewed and stable Respiratory status: spontaneous breathing and nonlabored ventilation Cardiovascular status: blood pressure returned to baseline and stable Postop Assessment: no headache, no backache, epidural receding, patient able to bend at knees and no signs of nausea or vomiting Anesthetic complications: no Comments: Platelet count dropped to 81k post op. Will leave epidural in situ and recheck CBC in am.     Last Vitals:  Filed Vitals:   03/07/16 0826 03/07/16 0830  BP:  156/81  Pulse: 96 92  Temp: 38.2 C   Resp: 25 25    Last Pain:  Filed Vitals:   03/07/16 0835  PainSc: Asleep   Pain Goal: Patients Stated Pain Goal: 6 (03/06/16 0717)               Shenouda Genova A.

## 2016-03-07 NOTE — Progress Notes (Signed)
LABOR PROGRESS NOTE  Alejandra Peterson is a 40 y.o. G4P0030 at 2250w3d  admitted for prom  Subjective: No pain s/p epidural. No fever  Objective: BP 111/62 mmHg  Pulse 70  Temp(Src) 98.2 F (36.8 C) (Oral)  Resp 18  Ht 5' 7.32" (1.71 m)  Wt 185 lb 3 oz (84 kg)  BMI 28.73 kg/m2  SpO2 99%  LMP 06/04/2015 or  Filed Vitals:   03/06/16 2351 03/06/16 2355 03/07/16 0000 03/07/16 0001  BP: 125/58   111/62  Pulse: 67 68 66 70  Temp:      TempSrc:      Resp:      Height:      Weight:      SpO2:  100% 99%     140/mod/-a/recurrent late decels 9.5/100/0  Labs: Lab Results  Component Value Date   WBC 8.4 03/06/2016   HGB 11.9* 03/06/2016   HCT 34.4* 03/06/2016   MCV 94.2 03/06/2016   PLT 96* 03/06/2016    Patient Active Problem List   Diagnosis Date Noted  . ROM (rupture of membranes), premature 03/06/2016  . Fetal arrhythmia affecting pregnancy, antepartum 02/15/2016  . Thrombocytopenia during pregnancy (HCC) 01/19/2016  . Supervision of low-risk pregnancy 01/18/2016    Assessment / Plan: 40 y.o. G4P0030 at 150w3d here for prom  Labor: now close to fully dilated but with recurrent decels. Will attempt repositioning and if not successful will d/c pitocin to allow fetus time to recover. Hopefully will then be able to progress to complete and begin pushing Fetal Wellbeing:  Cat 2 Pain Control:  epidural Anticipated MOD:  Hopeful for vaginal delivery  Silvano BilisNoah B Lorra Freeman, MD 03/07/2016, 1:03 AM

## 2016-03-07 NOTE — Progress Notes (Signed)
LABOR PROGRESS NOTE  Gertrude Adamu Borico is a 40 y.o. G4P0030 at 5931w4d  admitted for srom.  Subjective: Pain in neck w/ pushing  Objective: BP 130/69 mmHg  Pulse 70  Temp(Src) 99 F (37.2 C) (Oral)  Resp 18  Ht 5' 7.32" (1.71 m)  Wt 185 lb 3 oz (84 kg)  BMI 28.73 kg/m2  SpO2 100%  LMP 06/04/2015 or  Filed Vitals:   03/07/16 0101 03/07/16 0131 03/07/16 0201 03/07/16 0331  BP: 149/122 130/69    Pulse:  70    Temp:   98.6 F (37 C) 99 F (37.2 C)  TempSrc:   Oral Oral  Resp: 18 18    Height:      Weight:      SpO2:        135/mod/-a/late decels Dilation: 10 Dilation Complete Date: 03/07/16 Dilation Complete Time: 0300 Effacement (%): 70 Station: -3 Presentation: Vertex Exam by:: Darcella Shiffman  Labs: Lab Results  Component Value Date   WBC 8.4 03/06/2016   HGB 11.9* 03/06/2016   HCT 34.4* 03/06/2016   MCV 94.2 03/06/2016   PLT 96* 03/06/2016    Patient Active Problem List   Diagnosis Date Noted  . ROM (rupture of membranes), premature 03/06/2016  . Fetal arrhythmia affecting pregnancy, antepartum 02/15/2016  . Thrombocytopenia during pregnancy (HCC) 01/19/2016  . Supervision of low-risk pregnancy 01/18/2016    Assessment / Plan: 40 y.o. G4P0030 at 5731w4d here for prom.  Labor: variables throughout first stage. Recurrent late decels throughout second stage. Required two pitocin breaks and terbutaline x1. Complete at 03:00. Pushed for one hour, then labored down, then pushed again. Did not proceed past plus 2 station. Fetus is OT. Vacuum attempted after obtaining verbal informed consent, pulling with four contractions, no pop-offs, with no appreciable descent of fetal vertex. Decision made to proceed with c/s for arrest of second stage and fetal indications.  Vertex manually lifted back into pelvis Fetal Wellbeing:  Cat 2 Pain Control:  epidural  Silvano BilisNoah B Shauntee Karp, MD 03/07/2016, 6:00 AM

## 2016-03-07 NOTE — Anesthesia Postprocedure Evaluation (Signed)
Anesthesia Post Note  Patient: Alejandra Peterson  Procedure(s) Performed: Procedure(s) (LRB): CESAREAN SECTION (N/A)  Patient location during evaluation: Antenatal Anesthesia Type: Epidural Level of consciousness: awake, awake and alert, oriented and patient cooperative Pain management: pain level controlled Vital Signs Assessment: post-procedure vital signs reviewed and stable Respiratory status: spontaneous breathing, nonlabored ventilation and respiratory function stable Cardiovascular status: stable Postop Assessment: no headache, no backache, patient able to bend at knees and no signs of nausea or vomiting Anesthetic complications: no Comments: Spanish interpreter at bedside during interview.     Last Vitals:  Filed Vitals:   03/07/16 1306 03/07/16 1312  BP:    Pulse: 76 73  Temp:    Resp:      Last Pain:  Filed Vitals:   03/07/16 1318  PainSc: 0-No pain   Pain Goal: Patients Stated Pain Goal: 3 (03/07/16 0939)               Eshawn Coor L

## 2016-03-07 NOTE — Op Note (Signed)
Cesarean Section Operative Report  Bo Adamu Borico  03/06/2016 - 03/07/2016  Indications: arrest of second stage; fetal indications  Pre-operative Diagnosis: Arrest of decent, Fetal Indications.   Post-operative Diagnosis: Same   Surgeon: Surgeon(s) and Role: Panel 1:    * Reva Boresanya S Pratt, MD - Primary  Panel 2:    * Kathrynn RunningNoah Bedford Fannye Myer, MD - Primary   Attending Attestation: I was present & scrubbed for the entire procedure.   Anesthesia: epidural    Estimated Blood Loss: 800 ml  Total IV Fluids: 2500 ml LR  Urine Output:: 200 ml dark yellow/orange urine  Specimens: placenta to pathology  Findings: Viable female infant in cephalic presentation; Apgars 1/5; weight (still pending POD1); arterial cord pH 7.07; thick meconium-stained amniotic fluid; intact placenta with three vessel cord; normal uterus, fallopian tubes and ovaries bilaterally.  Baby condition / location:  NICU   Complications: no complications  Indications: [name] is a 40 y.o. [id number] with an IUP 5872w4d presenting with PROM. Please see progress note for indications for surgery.  The risks, benefits, complications, treatment options, and expected outcomes were discussed with the patient . The patient concurred with the proposed plan, giving informed consent. identified as [name] and the procedure verified as C-Section Delivery.  Procedure Details:  The patient was taken back to the operative suite where epidural anesthesia was dosed.  A time out was held and the above information confirmed.   After induction of anesthesia, the patient was draped and prepped in the usual sterile manner and placed in a dorsal supine position with a leftward tilt. A Pfannenstiel incision was made and carried down through the subcutaneous tissue to the fascia. Fascial incision was made and sharply extended transversely. The fascia was separated from the underlying rectus tissue superiorly and  inferiorly. The peritoneum was identified and bluntly entered and extended longitudinally. Alexis retractor was placed. A low transverse uterine incision was made and extended bluntly. Delivered from cephalic presentation was a viable infant with Apgars and weight as above. The umbilical cord was clamped and cut cord blood was obtained for evaluation. Cord ph was sent. The placenta was removed Intact and appeared normal.  The uterine incision was closed with running locked sutures of 0Vicryl with an imbricating layer of the same.   Hemostasis was observed. The peritoneum was closed with 0 vicryl. The rectus muscles were examined and hemostasis observed. The fascia was then reapproximated with running sutures of 0Vicryl. The skin was closed with 4-0Vicryl.   Instrument, sponge, and needle counts were correct prior the abdominal closure and were correct at the conclusion of the case.   A bleeding sulcal laceration was noted prior to starting the procedure. After finishing the procedure the patient was placed in stirrups for repair of that laceration, but it was found to be hemostatic and well-approximated, so no repair was performed.    Disposition: PACU - hemodynamically stable.   Maternal Condition: stable       Signed: Lavonne Chickoah B WoukMD 03/07/2016 7:05 AM

## 2016-03-07 NOTE — Consult Note (Signed)
[facility]  Delivery Note: C-section 03/07/2016 5:44 AM  I was called to the operating room at the request of the patient's obstetrician (Dr. Pratt) for a c-section due to non reassuring fetal heart tones.  PRENATAL HX: This is a 39 y/o G4P0030 at 39 and 4/[redacted] weeks gestation who was admitted on 6/20 for SROM at 0500 (ROM 25 hours) that was meconium stained. Her pregnancy has been complicated by late prenatal care in the [place] (she did receive complete prenatal care prior to immigration), thrombocytopenia, possible fetal arrhythmia, AMA, and LGA infant. The fetal arrhythmia was suspected due to irregular HR on NST during 36th week, so she has been receiving 2x weekly BPPs with good variability. As she was nearing delivery, the fetus began to have more frequent late decelerations, so NICU was called to the delivery and delivery was converted to a c-section for failure to descend.   DELIVERY: Infant had poor tone and respiratory effort at delivery with HR ~ 60 bpm. He responded briefly to standard warming, drying and stimulation with HR increase to 120, but then he developed apnea and HR decreased to 60 bpm. PPV administered at ~1.5 minutes for 30 seconds with immediate improvement in HR. He began to breathe spontaneously but respiratory effort was shallow and HR began to decrease again. PPV administered a 2nd time at ~ 3 minutes of age for 30 seconds. HR improved to 150s where it remained. O2 saturations in 60s so blow by O2 applied and infant continued to require blow by O2 to achieve appropriate saturations. APGARs 1, 5 and 6. Exam notable for molding, caput, mild hypotonia, and increased work of breathing. Will admit to NICU for further respiratory support and sepsis evaluation.  _____________________ Electronically Signed By: Jesse Nosbisch, MD Neonatologist 

## 2016-03-08 ENCOUNTER — Inpatient Hospital Stay (HOSPITAL_COMMUNITY): Payer: Medicaid Other

## 2016-03-08 LAB — CBC
HCT: 30.3 % — ABNORMAL LOW (ref 36.0–46.0)
HEMATOCRIT: 33.2 % — AB (ref 36.0–46.0)
HEMOGLOBIN: 10.6 g/dL — AB (ref 12.0–15.0)
HEMOGLOBIN: 11.6 g/dL — AB (ref 12.0–15.0)
MCH: 32.4 pg (ref 26.0–34.0)
MCH: 32.6 pg (ref 26.0–34.0)
MCHC: 35 g/dL (ref 30.0–36.0)
MCHC: 34.9 g/dL (ref 30.0–36.0)
MCV: 92.7 fL (ref 78.0–100.0)
MCV: 93.3 fL (ref 78.0–100.0)
Platelets: 86 10*3/uL — ABNORMAL LOW (ref 150–400)
Platelets: 105 10*3/uL — ABNORMAL LOW (ref 150–400)
RBC: 3.27 MIL/uL — AB (ref 3.87–5.11)
RBC: 3.56 MIL/uL — AB (ref 3.87–5.11)
RDW: 14 % (ref 11.5–15.5)
RDW: 14 % (ref 11.5–15.5)
WBC: 11 10*3/uL — AB (ref 4.0–10.5)
WBC: 13.6 10*3/uL — ABNORMAL HIGH (ref 4.0–10.5)

## 2016-03-08 LAB — COMPREHENSIVE METABOLIC PANEL
ALK PHOS: 101 U/L (ref 38–126)
ALT: 12 U/L — AB (ref 14–54)
AST: 27 U/L (ref 15–41)
Albumin: 2.5 g/dL — ABNORMAL LOW (ref 3.5–5.0)
Anion gap: 8 (ref 5–15)
BILIRUBIN TOTAL: 0.6 mg/dL (ref 0.3–1.2)
BUN: 6 mg/dL (ref 6–20)
CALCIUM: 8.6 mg/dL — AB (ref 8.9–10.3)
CO2: 25 mmol/L (ref 22–32)
Chloride: 98 mmol/L — ABNORMAL LOW (ref 101–111)
Creatinine, Ser: 0.59 mg/dL (ref 0.44–1.00)
GFR calc Af Amer: >60 mL/min (ref >60)
GFR calc non Af Amer: >60 mL/min (ref >60)
GLUCOSE: 100 mg/dL — AB (ref 65–99)
Potassium: 3.7 mmol/L (ref 3.5–5.1)
SODIUM: 131 mmol/L — AB (ref 135–145)
Total Protein: 5.7 g/dL — ABNORMAL LOW (ref 6.5–8.1)

## 2016-03-08 MED ORDER — KCL IN DEXTROSE-NACL 10-5-0.45 MEQ/L-%-% IV SOLN
INTRAVENOUS | Status: DC
  Administered 2016-03-08 – 2016-03-12 (×5): via INTRAVENOUS
  Filled 2016-03-08 (×16): qty 1000

## 2016-03-08 NOTE — Progress Notes (Signed)
I assisted FP with explanation of care plan.  Alejandra Peterson  Interpreter. °

## 2016-03-08 NOTE — Progress Notes (Signed)
Foley Cath removed at 0600.

## 2016-03-08 NOTE — Progress Notes (Signed)
I check on pt needs I ordered her meals, by Orlan LeavensViria Alvarez Spanish Interpreter.

## 2016-03-08 NOTE — Progress Notes (Signed)
CSW attempted to meet with MOB to introduce services, offer support, and complete assessment due to baby's admission to NICU, but initially the Spanish Interpreter was not available and then the RN was in with patient and stated it was not a good time to talk with MOB.  CSW will attempt again at a later time.  RN reports MOB is doing well. 

## 2016-03-08 NOTE — Progress Notes (Signed)
Called to evaluate abdominal distension. POD1 from pltcs. On exam patient in no acute distress. Mild htn, afebrile. Abdomen is distended and diffusely ttp. No perineal bruising/hematoma. Pressure bandage clean. Passing gas and tolerating diet. Will check KUB to eval for ileus, and CBC to eval for intraabdominal bleeding. Will make NPO for now.

## 2016-03-08 NOTE — Progress Notes (Signed)
POD#1 Subjective:  Alejandra Peterson is a 40 y.o. G4P1030 6342w4d s/p pLTCS after failed IOL for PROM and failed trial of vaccuum.  No acute events overnight.  Pt denies problems with po intake. She has not ambulated yet. Foley came out this morning. She denies nausea or vomiting. She denies headache, vision changes or RUQ.  Pain is well controlled.  She hasn't had flatus.  Lochia Minimal.  Plan for birth control will be discussed later.  Method of Feeding: bottle  Objective: Blood pressure 124/69, pulse 78, temperature 98.6 F (37 C), temperature source Oral, resp. rate 18, height 5\' 7"  (1.702 m), weight 185 lb 3 oz (84 kg), last menstrual period 06/04/2015, SpO2 94 %, unknown if currently breastfeeding.  Physical Exam:  General: alert, cooperative and no distress Lochia:normal flow Chest: normal WOB Heart: Regular rate Abdomen: +BS, soft, tender than usual. Some of it looks anticipatory as well as she guards before I touch her.  Uterine Fundus: appears firm DVT Evaluation: No evidence of DVT seen on physical exam. Extremities: no edema   Recent Labs  03/07/16 0812 03/08/16 0506  HGB 13.4 10.6*  HCT 39.6 30.3*    Assessment/Plan:  ASSESSMENT: Alejandra Peterson is a 40 y.o. G4P1030 6942w4d s/p pLTCS after failed IOL for PROM and failed trial of vaccuum. Abdomen tender than usual but there is some component of anticipation as well as.  Last fever at 9:30 am yesterday. She has been on Amp and Clinda. She received gentamycin as well.  I am not convinced by her tenderness. So will d/c antibiotics given no fever or tach. WBC 11 as well. She is also on Mag for severe range BP after delivery. Her blood pressures has been within normal range almost for 24 hours now. She came of Mag at 9:30 am today.  Plan for discharge tomorrow at earliest Continue routine PP care Baby in NICU Will discuss contraception Breastfeeding support PRN  LOS: 2 days   Almon Herculesaye T Darice Vicario 03/08/2016, 10:06 AM    Used in person interpretor for this encounter

## 2016-03-09 ENCOUNTER — Other Ambulatory Visit: Payer: Self-pay

## 2016-03-09 MED ORDER — GENTAMICIN SULFATE 40 MG/ML IJ SOLN
7.0000 mg/kg | INTRAVENOUS | Status: DC
  Administered 2016-03-09: 490 mg via INTRAVENOUS
  Filled 2016-03-09 (×2): qty 12.25

## 2016-03-09 MED ORDER — CLINDAMYCIN PHOSPHATE 900 MG/50ML IV SOLN
900.0000 mg | Freq: Three times a day (TID) | INTRAVENOUS | Status: DC
Start: 2016-03-09 — End: 2016-03-10
  Administered 2016-03-09 (×2): 900 mg via INTRAVENOUS
  Filled 2016-03-09 (×4): qty 50

## 2016-03-09 MED ORDER — OXYCODONE HCL 5 MG PO TABS
10.0000 mg | ORAL_TABLET | Freq: Once | ORAL | Status: AC
  Administered 2016-03-09: 10 mg via ORAL
  Filled 2016-03-09: qty 2

## 2016-03-09 NOTE — Progress Notes (Signed)
Epidural intact to back. ?

## 2016-03-09 NOTE — Progress Notes (Signed)
I spoke with pt at her son's bedside in NICU. She reported that she was doing well and that she did not have any particular concerns at this time, but that she would let us know if she needs additional support. She was in good spirits and was admiring her baby and looking for family features on his face.  Please page as needs arise or as family requests.  Chaplain Dyanne CarrelKaty Naamah Boggess, Bcc Pager, [phone] 3:19 PM    03/09/16 1500  Clinical Encounter Type  Visited With Patient and family together  Visit Type Spiritual support;Initial

## 2016-03-09 NOTE — Progress Notes (Signed)
Pt temperature 102.7. Notified Rejeana BrockKelly Aguilar MD. Order to give Tylenol 650 mg PO and recheck temperature in 30 minutes. Call back if temperature is greater than 100.4

## 2016-03-09 NOTE — Progress Notes (Signed)
POD#2 Subjective:  Alejandra Peterson is a 40 y.o. [id number] 4561w4d s/p pLTCS due to arrest in second stage.  Patient without bowel activity. Concern for ileus on KUB yesterday. No fever overnight but some mildly elevated temps to 100.2 last night and 100 this morning. She has been NPO. Pt ambulated with assistance yesterday. She reports pain 7-8 on 10 point scale. Denies issues with voiding. She is NPO due to concern for ileus. She denies nausea or vomiting.  Pain is poorly controlled.  She has not had flatus.  Lochia Minimal.  Plan for birth control is not discussed yet.  Method of Feeding: breast  Objective: Blood pressure 130/76, pulse 98, temperature 100.2 F (37.9 C), temperature source Oral, resp. rate 18, height 5\' 7"  (1.702 m), weight 185 lb 3 oz (84 kg), last menstrual period 06/04/2015, SpO2 95 %, unknown if currently breastfeeding.  Physical Exam:  General: alert, cooperative and no distress Lochia:normal flow Chest: normal WOB Heart: Regular rate Abdomen: no BS, distended, very tender to palpation Uterine Fundus: not able to evaluate due to tenderness DVT Evaluation: No evidence of DVT seen on physical exam. Extremities: no edema   Recent Labs  03/08/16 0506 03/08/16 1818  HGB 10.6* 11.6*  HCT 30.3* 33.2*    Assessment/Plan:  ASSESSMENT: Alejandra Peterson is a 40 y.o. [id number] 5061w4d s/p pLTCS now with findings concerning for ileus.  -Gave oxy 10 mg prior to ambulation for good pain control -Discussed with her RN to ambulate patient as much as possible -Resume gent and clindamycin  -continue NPO until she passes gases at least -Continue D51/2NS@125  until good PO  -Continue routine PP care -Breastfeeding support PRN. Baby in NICU  LOS: 3 days   Almon Herculesaye T Gonfa 03/09/2016, 11:37 AM   CNM attestation Post Partum Day #2 I have seen and examined this patient and agree with above documentation in the resident's note.   Alejandra Peterson is a 40 y.o.  G4P1030 s/p pLTCS.  Pt reports problems with abd discomfort during ambulation. No difficulties voiding.  Plan for birth control is undecided.  Method of Feeding: bottle. Concern for illeus from last PM.  PE:  BP 130/76 mmHg  Pulse 98  Temp(Src) 100.2 F (37.9 C) (Oral)  Resp 18  Ht 5\' 7"  (1.702 m)  Wt 84 kg (185 lb 3 oz)  BMI 28.73 kg/m2  SpO2 95%  LMP 06/04/2015  Breastfeeding? Unknown Fundus firm  Plan  NPO until passing flatus  Cam HaiSHAW, KIMBERLY, CNM 3:54 PM  03/09/2016

## 2016-03-09 NOTE — Progress Notes (Signed)
CSW unable to follow up with MOB today.  CSW spoke with Chaplain who has met with MOB and states she appears to be coping well at this time. 

## 2016-03-09 NOTE — Progress Notes (Signed)
I assisted Shanda Bumpsjessica, RN with explanation of care plan.  Eda H Royal  Interpreter.

## 2016-03-10 LAB — CBC
HCT: 30.1 % — ABNORMAL LOW (ref 36.0–46.0)
Hemoglobin: 10.4 g/dL — ABNORMAL LOW (ref 12.0–15.0)
MCH: 31.9 pg (ref 26.0–34.0)
MCHC: 34.6 g/dL (ref 30.0–36.0)
MCV: 92.3 fL (ref 78.0–100.0)
PLATELETS: 128 10*3/uL — AB (ref 150–400)
RBC: 3.26 MIL/uL — ABNORMAL LOW (ref 3.87–5.11)
RDW: 13.5 % (ref 11.5–15.5)
WBC: 7.5 10*3/uL (ref 4.0–10.5)

## 2016-03-10 MED ORDER — PIPERACILLIN-TAZOBACTAM 3.375 G IVPB
3.3750 g | Freq: Three times a day (TID) | INTRAVENOUS | Status: DC
  Administered 2016-03-10 – 2016-03-13 (×10): 3.375 g via INTRAVENOUS
  Filled 2016-03-10 (×11): qty 50

## 2016-03-10 NOTE — Progress Notes (Signed)
POSTPARTUM PROGRESS NOTE  Post Partum Day 3 Subjective:  Alejandra Peterson is a 40 y.o. [id number] 1770w4d s/p pLTCS for fetal indications and arrest in descent.  Pt postpartum course has been complicated by pre-eclampsia with severe features, ROM 25hrs, suspected triple I (initially on gentamicin and clindamycin, switched at 0200 today to zosyn).  Despite changes in antibiotics pt continues to have persistent fevers overnight  (Tmax 102.8)  Pt does report some mild SOB.  VS remain stable with BP trending around 130/80.    Pt denies problems with ambulating, voiding or po intake.  She denies nausea or vomiting.  Pain is well controlled.  She has had flatus. She has not had bowel movement.  Lochia Small.   Objective: Blood pressure 136/74, pulse 98, temperature 102.8 F (39.3 C), temperature source Oral, resp. rate 18, height 5\' 7"  (1.702 m), weight 185 lb 3 oz (84 kg), last menstrual period 06/04/2015, SpO2 95 %, unknown if currently breastfeeding.  Physical Exam:  General: alert, cooperative and no distress Lochia:normal flow Chest: CTAB, no rales/wheezing/rhonchi Heart: RRR no m/r/g Abdomen: distended, +BS, soft, nontender,  Uterine Fundus: firm, 2 below umbilicus Back: Epidural c/d/i DVT Evaluation: No calf swelling or tenderness, negative Homan's sign bilaterally Extremities: Trace edema   Recent Labs  03/08/16 0506 03/08/16 1818  HGB 10.6* 11.6*  HCT 30.3* 33.2*    Assessment/Plan:  ASSESSMENT: [name] is a 40 y.o. G4P1030 7670w4d s/p pLTCS for fetal indications/ arrest in descent  #Post-op ileus -Cont ambulation and up to chair daily -Simethecone for gas relief -Strict NPO and I/O, consider NGT if distension worsens for decompression -D5NS@125cc /hr  #Confirmed Triple I, ROM 25hrs -Cont zosyn, consider adding vanc if concern for MRSA -F/u blood culture/ urine cultures -Antipyretic measures: apap, external cooling measures, NO NSAIDS  #Pre-eclampsia  w/ severe features -s/p 24 hour Mag -No prn meds at this time, consider starting HCTZ/ amlodipine if BP elevate >160/110 -Strict I/O  #Gestational thrombocytopenia -Monitor PLT  Infant continues in NICU for sepsis/ triple I, currently on amp/gent   LOS: 4 days   Olena LeatherwoodKelly M Aguilar 03/10/2016, 4:40 AM   Midwife attestation Post Partum Day 3 I have seen and examined this patient and agree with above documentation in the resident's note.   Alejandra Peterson is a 40 y.o. G4P1030 s/p pCS.  Pt denies problems with ambulating, voiding or po intake. +flatus. Pain is well controlled.    PE:  BP 128/78 mmHg  Pulse 83  Temp(Src) 99.1 F (37.3 C) (Oral)  Resp 18  Ht 5\' 7"  (1.702 m)  Wt 185 lb 3 oz (84 kg)  BMI 28.73 kg/m2  SpO2 98%  LMP 06/04/2015  Breastfeeding? Unknown Gen: well appearing Heart: reg rate Lungs: normal WOB Fundus firm Ext: soft, no pain, no edema  A/Plan  Status post Cesarean section. Postoperative course complicated by fever, ?ileus, pre-e, thrombocytopenia Advance to reg diet Ambulate in halls Recheck platelets-d/c epidural catheter if appropriate  Donette LarryMelanie Tyyne Cliett, CNM 11:53 AM

## 2016-03-10 NOTE — Progress Notes (Signed)
Asked by RN to see pt; questions regarding diet and epidural  Subjective: Postpartum Day 3: Cesarean Delivery Patient reports + flatus x4. No N/V.  Objective: Vital signs in last 24 hours: Temp:  [98.6 F (37 C)-102.8 F (39.3 C)] 98.6 F (37 C) (06/24 0612) Pulse Rate:  [91-98] 91 (06/24 0612) Resp:  [18] 18 (06/24 0612) BP: (132-136)/(70-74) 132/70 mmHg (06/24 0612)  Physical Exam:  General: alert, cooperative and no distress GI: +BS, mild distention, soft   Recent Labs  03/08/16 0506 03/08/16 1818  HGB 10.6* 11.6*  HCT 30.3* 33.2*    Assessment/Plan: Status post Cesarean section. Postoperative course complicated by fever, ?ileus, pre-e, thrombocytopenia Advance to reg diet Ambulate in halls Recheck platelets-d/c epidural catheter if appropriate  Donette LarryMelanie Yuji Walth, CNM 03/10/2016, 5:51 PM

## 2016-03-11 LAB — URINE CULTURE: SPECIAL REQUESTS: NORMAL

## 2016-03-11 NOTE — Progress Notes (Signed)
POSTPARTUM PROGRESS NOTE  Post Op Day 4 Subjective:  Serenna Adamu Borico is a 40 y.o. [id number] 5688w4d s/p pLTCS for arrest in descent and fetal indications, ROM 25 hrs, triple I, gestational thrombocytopenia.  Presented with postpartum pre-eclampsia with severe features.  Pt had fever (Tmax 100.4) overnight despite being on zosyn since 0400 on 6/24.  Pt denies problems with ambulating, voiding or po intake.  She denies nausea or vomiting.  Pt continues to report abdominal tenderness and voluntary guarding upon examimination, poorly controlled.  She has had flatus. She has had bowel movement.  Lochia Small.   Objective: Blood pressure 144/75, pulse 79, temperature 98.5 F (36.9 C), temperature source Oral, resp. rate 18, height 5\' 7"  (1.702 m), weight 185 lb 3 oz (84 kg), last menstrual period 06/04/2015, SpO2 99 %, unknown if currently breastfeeding.  Physical Exam:  General: alert, cooperative and no distress Lochia:normal flow Chest: CTAB Heart: RRR no m/r/g Abdomen: +BS, soft, nontender,  Uterine Fundus: Unable to examine, due to pt guarding DVT Evaluation: No calf swelling or tenderness Extremities: Trace edema   Recent Labs  03/08/16 1818 03/10/16 1824  HGB 11.6* 10.4*  HCT 33.2* 30.1*    Assessment/Plan:  ASSESSMENT: Nargis Adamu Borico is a 40 y.o. G4P1030 1088w4d s/p pLTCS  #Post-op ileus-resolved  #Confirmed Triple I, ROM 25hrs -Cont zosyn, consider adding vanc if concern for MRSA -BCx/UCx- NGTD -Antipyretic measures: apap, external cooling measures, NO NSAIDS  #Pre-eclampsia w/ severe features -s/p 24 hour Mag  #Gestational thrombocytopenia -Monitor PLT  Infant continues in NICU for sepsis/ triple I, currently on amp/gent   LOS: 5 days   Olena LeatherwoodKelly M Aguilar 03/11/2016, 9:03 AM

## 2016-03-11 NOTE — Progress Notes (Signed)
PATIENT REQUESTED TO WAIT TO HAVE EPIDURAL CATHETER DISCONTINUED TIL WHEN SHE IS MORE AWAKE, SHE WISHES TO SLEEP AT THIS TIME

## 2016-03-12 ENCOUNTER — Encounter (HOSPITAL_COMMUNITY): Payer: Self-pay | Admitting: Radiology

## 2016-03-12 ENCOUNTER — Inpatient Hospital Stay (HOSPITAL_COMMUNITY): Payer: Medicaid Other

## 2016-03-12 DIAGNOSIS — O411290 Chorioamnionitis, unspecified trimester, not applicable or unspecified: Secondary | ICD-10-CM | POA: Diagnosis not present

## 2016-03-12 DIAGNOSIS — Z98891 History of uterine scar from previous surgery: Secondary | ICD-10-CM

## 2016-03-12 LAB — CBC WITH DIFFERENTIAL/PLATELET
BASOS ABS: 0 10*3/uL (ref 0.0–0.1)
Basophils Relative: 1 %
Eosinophils Absolute: 0.1 10*3/uL (ref 0.0–0.7)
Eosinophils Relative: 2 %
HEMATOCRIT: 28.1 % — AB (ref 36.0–46.0)
Hemoglobin: 9.8 g/dL — ABNORMAL LOW (ref 12.0–15.0)
LYMPHS ABS: 1.7 10*3/uL (ref 0.7–4.0)
LYMPHS PCT: 22 %
MCH: 31.9 pg (ref 26.0–34.0)
MCHC: 34.9 g/dL (ref 30.0–36.0)
MCV: 91.5 fL (ref 78.0–100.0)
Monocytes Absolute: 0.5 10*3/uL (ref 0.1–1.0)
Monocytes Relative: 6 %
NEUTROS ABS: 5.2 10*3/uL (ref 1.7–7.7)
Neutrophils Relative %: 70 %
Platelets: 142 10*3/uL — ABNORMAL LOW (ref 150–400)
RBC: 3.07 MIL/uL — AB (ref 3.87–5.11)
RDW: 13.4 % (ref 11.5–15.5)
WBC: 7.5 10*3/uL (ref 4.0–10.5)

## 2016-03-12 LAB — COMPREHENSIVE METABOLIC PANEL
ALK PHOS: 86 U/L (ref 38–126)
ALT: 10 U/L — AB (ref 14–54)
AST: 17 U/L (ref 15–41)
Albumin: 2.4 g/dL — ABNORMAL LOW (ref 3.5–5.0)
Anion gap: 7 (ref 5–15)
BILIRUBIN TOTAL: 0.6 mg/dL (ref 0.3–1.2)
CALCIUM: 8.4 mg/dL — AB (ref 8.9–10.3)
CO2: 23 mmol/L (ref 22–32)
CREATININE: 0.49 mg/dL (ref 0.44–1.00)
Chloride: 103 mmol/L (ref 101–111)
GFR calc Af Amer: >60 mL/min (ref >60)
Glucose, Bld: 95 mg/dL (ref 65–99)
Potassium: 3.5 mmol/L (ref 3.5–5.1)
Sodium: 133 mmol/L — ABNORMAL LOW (ref 135–145)
TOTAL PROTEIN: 6 g/dL — AB (ref 6.5–8.1)

## 2016-03-12 MED ORDER — SIMETHICONE 80 MG PO CHEW
80.0000 mg | CHEWABLE_TABLET | Freq: Two times a day (BID) | ORAL | Status: DC
  Administered 2016-03-12 – 2016-03-14 (×5): 80 mg via ORAL
  Filled 2016-03-12 (×4): qty 1

## 2016-03-12 MED ORDER — SENNOSIDES-DOCUSATE SODIUM 8.6-50 MG PO TABS
1.0000 | ORAL_TABLET | Freq: Every day | ORAL | Status: DC
  Administered 2016-03-12 – 2016-03-13 (×2): 1 via ORAL
  Filled 2016-03-12 (×2): qty 1

## 2016-03-12 MED ORDER — DIATRIZOATE MEGLUMINE & SODIUM 66-10 % PO SOLN
30.0000 mL | Freq: Once | ORAL | Status: AC
  Administered 2016-03-12: 30 mL via ORAL
  Filled 2016-03-12: qty 30

## 2016-03-12 MED ORDER — IOPAMIDOL (ISOVUE-300) INJECTION 61%
100.0000 mL | Freq: Once | INTRAVENOUS | Status: AC | PRN
  Administered 2016-03-12: 100 mL via INTRAVENOUS

## 2016-03-12 NOTE — Clinical Social Work Note (Signed)
NICU CSW attempted to arrange for spanish interpreter to assist CSW in completing NICU assessment. MOB visiting in NICU and then interpreter had to be present in L&D. CSW will continue to follow and attempt to complete assessment later today or in the am.  Grier Kelsy Polack, LCSW  Coverning NICU for [facility]   

## 2016-03-12 NOTE — Progress Notes (Addendum)
Antepartum Update note  TECHNIQUE: Multidetector CT imaging of the abdomen and pelvis was performed using the standard protocol following bolus administration of intravenous contrast.  CONTRAST: 100mL ISOVUE-300 IOPAMIDOL (ISOVUE-300) INJECTION 61%  COMPARISON: None  FINDINGS: Lower chest: Small left pleural effusion is identified.  Hepatobiliary: There are scattered low attenuation foci within the liver. The largest measures 6 mm. Although too small to reliably characterize statistically these likely represent multiple cysts. The gallbladder appears normal. No biliary dilatation.  Pancreas: No mass, inflammatory changes, or other significant abnormality.  Spleen: Within normal limits in size and appearance.  Adrenals/Urinary Tract: Normal adrenal glands. The kidneys are unremarkable. No obstructive uropathy. There is gas identified within the urinary bladder. This may be related to instrumentation.  Stomach/Bowel: The stomach appears normal. The small bowel loops appear prominent measuring up to 2.7 cm. Small bowel fluid levels identified. No pathologic scratch set there is stool and gas identified within the normal caliber colon. No pathologic dilatation of the large or small bowel loops identified.  Vascular/Lymphatic: Normal appearance of the abdominal aorta. No enlarged retroperitoneal or mesenteric adenopathy. No enlarged pelvic or inguinal lymph nodes.  Reproductive: Postoperative appearance of the uterus compatible with the early postpartum period status post C-section. C-section defect within the lower uterine segment is identified, image 56 of series 603.  Other: No fluid collections within the left flank there is a low attenuation intramuscular fluid collection which measures 7.7 cm in length, 3.2 cm transverse, and 4.6 cm AP. No significant free fluid identified within the abdomen or pelvis.  Musculoskeletal: There are no aggressive lytic or  sclerotic bone lesions identified.  IMPRESSION: 1. Mild distension of the small bowel loops compatible with postoperative ileus. There is no pathologic dilatation of the large or small bowel loops identified at this time. 2. Postpartum appearance of the uterus status post C-section. 3. Gas noted within the urinary bladder which may be related to instrumentation 4. Low-attenuation intramuscular fluid collection within the left flank may reflect postoperative seroma or hematoma. Correlation for any clinical signs or symptoms of infection advise.   Electronically Signed  By: Signa Kellaylor Stroud M.D.  On: 03/12/2016 10:01  Last temp was 38.0 @ 0631 today with two normals after that  Patient with negative ROS except some belly tenderness. She continues to pass gas and has had two BMs thus far today. No nausea or vomiting.   NAD Rare BS, mildly distended. Mildly ttp at the incision with c/d/i dressing  Uterus slightly deviated to the right but no fundal tenderness Breast exam: negative on inspection and palpation  A/p: pt stable I reviewed the images with radiology and they state that if it is a hematoma, due to the low attenuation that it's chronic and images seem to show it fairly high up near the rib cage and this area was palpated and inspected and no skin changes and negative on palpation She doesn't want to breast feed or pump and doesn't want to wear a tight bra. I d/w her that leading DDx is mastitis and/or post op infectious process. If it's breast, then the zosyn and pumping and wearing a tight bra is the treatment and i strongly encouraged this. I told her she is at risk for post op infectious process given her long labor and SROM, failed 2nd stage and then c-section, which makes endometritis more plausible; there was no e/o abscess in the abdomen or pelvis on the CT.  Will continue zosyn and get her to 24hrs afebrile before considering  stopping the antibiotics.  Okay for  clears. Maybe advance diet tonight if still no GI s/s.   Patient declined interpreter and she was examined with RN present.   Cornelia Copaharlie Beverlyann Broxterman, Jr MD Attending Center for [facility] Mid[facility](Faculty Practice)

## 2016-03-12 NOTE — Clinical Social Work Note (Signed)
MOB watching teaching video when CSW attempted to see in NICU and complete assessment via language line. CSW hopeful to meet with pt later today or in the am.   Doreen SalvageGrier Shanon Becvar, LCSW  Coverning NICU for [facility]

## 2016-03-12 NOTE — Progress Notes (Addendum)
Subjective: Postpartum Day 5: Cesarean Delivery Patient reports incisional pain, tolerating PO, + flatus and no problems voiding.    Objective: Vital signs in last 24 hours: Temp:  [98.8 F (37.1 C)-100.4 F (38 C)] 100.4 F (38 C) (06/26 0631) Pulse Rate:  [70-75] 70 (06/26 0611) Resp:  [16-18] 18 (06/26 0611) BP: (134-144)/(73-79) 134/73 mmHg (06/26 0611) SpO2:  [99 %] 99 % (06/25 1839)  Physical Exam:  General: alert, cooperative and mild distress Lochia: appropriate Uterine Fundus: firm Incision: healing well, no significant drainage DVT Evaluation: No evidence of DVT seen on physical exam. Negative Homan's sign. No cords or calf tenderness. No significant calf/ankle edema.   Recent Labs  03/10/16 1824  HGB 10.4*  HCT 30.1*    Assessment/Plan: Status post Cesarean section, Abd distention with guarding.  Temo 100.4 ax @ 0630, Will GET CT of abd and pelvis, CBC, CMP Continue current care.  Wyvonnia DuskyMarie Lawson 03/12/2016, 7:22 AM  Attestation of Attending Supervision of Advanced Practitioner (CNM/NP): Evaluation and management procedures were performed by the Advanced Practitioner under my supervision and collaboration.  I have reviewed the Advanced Practitioner's note and chart I have seen the pt:  Exam significant for pain much greater than expected this far post op.  There is voluntary guarding and distension.  The BS are +.  Have ordered CT scan to eval and CBC Urine and blood cx pending    HARRAWAY-SMITH, [place] Vuncannon 8:55 AM

## 2016-03-13 DIAGNOSIS — N719 Inflammatory disease of uterus, unspecified: Secondary | ICD-10-CM | POA: Diagnosis not present

## 2016-03-13 DIAGNOSIS — O1410 Severe pre-eclampsia, unspecified trimester: Secondary | ICD-10-CM

## 2016-03-13 LAB — CBC WITH DIFFERENTIAL/PLATELET
BASOS PCT: 1 %
Basophils Absolute: 0.1 10*3/uL (ref 0.0–0.1)
EOS PCT: 2 %
Eosinophils Absolute: 0.2 10*3/uL (ref 0.0–0.7)
HCT: 30.1 % — ABNORMAL LOW (ref 36.0–46.0)
Hemoglobin: 10.3 g/dL — ABNORMAL LOW (ref 12.0–15.0)
LYMPHS ABS: 1.9 10*3/uL (ref 0.7–4.0)
Lymphocytes Relative: 22 %
MCH: 31.5 pg (ref 26.0–34.0)
MCHC: 34.2 g/dL (ref 30.0–36.0)
MCV: 92 fL (ref 78.0–100.0)
MONO ABS: 0.4 10*3/uL (ref 0.1–1.0)
Monocytes Relative: 5 %
Neutro Abs: 5.9 10*3/uL (ref 1.7–7.7)
Neutrophils Relative %: 70 %
PLATELETS: 179 10*3/uL (ref 150–400)
RBC: 3.27 MIL/uL — ABNORMAL LOW (ref 3.87–5.11)
RDW: 13.6 % (ref 11.5–15.5)
WBC: 8.5 10*3/uL (ref 4.0–10.5)

## 2016-03-13 LAB — BASIC METABOLIC PANEL
Anion gap: 4 — ABNORMAL LOW (ref 5–15)
CHLORIDE: 105 mmol/L (ref 101–111)
CO2: 26 mmol/L (ref 22–32)
CREATININE: 0.45 mg/dL (ref 0.44–1.00)
Calcium: 8.5 mg/dL — ABNORMAL LOW (ref 8.9–10.3)
GFR calc Af Amer: >60 mL/min (ref >60)
GFR calc non Af Amer: >60 mL/min (ref >60)
Glucose, Bld: 87 mg/dL (ref 65–99)
Potassium: 3.6 mmol/L (ref 3.5–5.1)
Sodium: 135 mmol/L (ref 135–145)

## 2016-03-13 MED ORDER — AMOXICILLIN-POT CLAVULANATE 875-125 MG PO TABS
1.0000 | ORAL_TABLET | Freq: Two times a day (BID) | ORAL | Status: DC
  Administered 2016-03-13 – 2016-03-14 (×3): 1 via ORAL
  Filled 2016-03-13 (×5): qty 1

## 2016-03-13 MED ORDER — AMOXICILLIN-POT CLAVULANATE 875-125 MG PO TABS
1.0000 | ORAL_TABLET | Freq: Two times a day (BID) | ORAL | Status: AC
Start: 2016-03-13 — End: 2016-03-18

## 2016-03-13 MED ORDER — DOCUSATE SODIUM 100 MG PO CAPS: 100.0000 mg | ORAL_CAPSULE | Freq: Two times a day (BID) | ORAL | Status: AC

## 2016-03-13 MED ORDER — SIMETHICONE 80 MG PO CHEW: 80.0000 mg | CHEWABLE_TABLET | Freq: Two times a day (BID) | ORAL | Status: AC

## 2016-03-13 MED ORDER — OXYCODONE-ACETAMINOPHEN 5-325 MG PO TABS: 1.0000 | ORAL_TABLET | Freq: Four times a day (QID) | ORAL | Status: DC | PRN

## 2016-03-13 NOTE — Progress Notes (Signed)
Subjective: Postpartum Day 5: Cesarean Delivery Patient reports incisional pain, tolerating PO, + flatus and no problems voiding.    Objective: Vital signs in last 24 hours: Temp:  [98.2 F (36.8 C)-99.5 F (37.5 C)] 98.4 F (36.9 C) (06/27 0500) Pulse Rate:  [65] 65 (06/27 0500) Resp:  [16-18] 18 (06/27 0500) BP: (138-141)/(74-77) 138/74 mmHg (06/27 0500)  Physical Exam:  General: alert, cooperative and mild distress Lochia: appropriate Uterine Fundus: firm Incision: healing well, no significant drainage DVT Evaluation: No evidence of DVT seen on physical exam. Negative Homan's sign. No cords or calf tenderness. No significant calf/ankle edema.   Recent Labs  03/12/16 0715 03/13/16 0602  HGB 9.8* 10.3*  HCT 28.1* 30.1*    Assessment/Plan: Status post Cesarean section, Abd distention with guarding.  CT scan negative. Started yesterday on abx for suspected endometritis.  Will change to augmentin and monitor for 24 hours. Home if afebrile Continue current care.  [facility] 03/13/2016, 9:24 AM

## 2016-03-13 NOTE — Progress Notes (Signed)
Patient reports pain in left lower calf. Assessed for warmth, firmness, and redness. No signs of clot noted. Will continue to assess.

## 2016-03-13 NOTE — Progress Notes (Signed)
I check on pt need, I ordered her meals, by Orlan LeavensViria Alvarez Spanish Interpreter.

## 2016-03-13 NOTE — Clinical Social Work Maternal (Signed)
CLINICAL SOCIAL WORK MATERNAL/CHILD NOTE  Patient Details  Name: Alejandra Peterson MRN: [id number] Date of Birth: 01/28/1976  Date: 03/13/2016  Clinical Social Worker Initiating Note: Niurka Benecke N Donicia Druck, LCSWDate/ Time Initiated: 03/13/16/0920   Child's Name:     Legal Guardian: Mother   Need for Interpreter: None   Date of Referral: 03/13/16   Reason for Referral: Other (Comment) (resources in community, support, meeting all needs for baby)   Referral Source: RN   Address:    Phone number:     Household Members: Self, Siblings   Natural Supports (not living in the home): Community   Professional Supports:None   Employment:Unemployed   Type of Work: none   Education: 9 to 11 years   Financial Resources:Medicaid   Other Resources: WIC   Cultural/Religious Considerations Which May Impact Care: none reported  Strengths: Ability to meet basic needs    Risk Factors/Current Problems: Other (Comment) (new to the [place], aclamating to [place], follow up)   Cognitive State: Alert , Able to Concentrate    Mood/Affect: Calm , Relaxed    CSW Assessment:LCSW received consult for support and resources and utilized interpreter: Eda for support during assessment. MOB was limited with information regarding support, housing, resources, and needs as she defers all questions to her brother. She [place] the [place] [redacted] weeks pregnant and did receive prenatal care in the states and [place]. [place] reason [place] was to have [place] baby and begin life here living with her brother. She reports FOB was involved during conception and one week after and has not been part of her life or baby life. Patient only has her brother here in the states, but plans to live with him and not work, but take care of the child. LCSW evaluated MOB emotional state, pain, and current well being. MOB reports she is feeling better and  pain is reducing each day. She denies and depression, reports she has been by to see baby in NICU, has no questions or concerns. LCSW continued to educate mother of safe sleep with regards to baby laying on his back when sleep and not of tummy or in large bed to reduce SIDS. Mother has been resistant to this per interpreter at times stating that in her country baby sleeps on his belly. Staff has been working with MOB and educating. MOB plans to room in with baby closer to DC from NICU.  LCSW made contact with the brother: Brian (Bri-heen) regarding clarity of discharge plan, home prepared for child and additional needs or concerns. Brother reports they have a crib, car seat, and all necessary means to care for the baby. LCSW offered a referral to healthy start to help MOB and baby establish a healthy beginning and start in community and be exposed to additional resources and interpreter, but bother denied referral. He reports they have everything they need and will be fine. Brother will come to pick patient up once she is medically discharged and has been by to see MOB daily in the evenings.   Brother reports she is active with Medicaid and plans to complete WIC appointment once she gets home. There are not current safety concerns at this time that warrant an other interventions or referrals. Home is prepared per brother. MOB defers all questions to brother and he has been available by phone when needed. LCSW will sign off at this time. No referral for healthy start made as brother declined.   CSW   Plan/Description: Information/Referral to Community Resources , No Further Intervention Required/No Barriers to Discharge  Brother declined referral to healthy start. CSW signing off. Please re-consult if needs arise.    Kambrea Carrasco N, LCSW 03/13/2016, 10:14 AM              

## 2016-03-14 ENCOUNTER — Inpatient Hospital Stay (HOSPITAL_COMMUNITY): Payer: Medicaid Other

## 2016-03-14 LAB — URINALYSIS, ROUTINE W REFLEX MICROSCOPIC
Bilirubin Urine: NEGATIVE
Glucose, UA: NEGATIVE mg/dL
Ketones, ur: NEGATIVE mg/dL
Leukocytes, UA: NEGATIVE
NITRITE: NEGATIVE
PROTEIN: 30 mg/dL — AB
SPECIFIC GRAVITY, URINE: 1.01 (ref 1.005–1.030)
pH: 6.5 (ref 5.0–8.0)

## 2016-03-14 LAB — URINE MICROSCOPIC-ADD ON
SQUAMOUS EPITHELIAL / LPF: NONE SEEN
WBC UA: NONE SEEN WBC/hpf (ref 0–5)

## 2016-03-14 MED ORDER — OXYCODONE-ACETAMINOPHEN 5-325 MG PO TABS: 1.0000 | ORAL_TABLET | Freq: Four times a day (QID) | ORAL | Status: AC | PRN

## 2016-03-14 NOTE — Discharge Instructions (Signed)
Cuidados en el postparto luego de un parto por cesárea  °(Postpartum Care After Cesarean Delivery) °Después del parto (período de postparto), la estadía normal en el hospital es de 24-72 horas. Si hubo problemas con el trabajo de parto o el parto, o si tiene otros problemas médicos, es posible que deba permanecer en el hospital por más tiempo.  °Mientras esté en el hospital, recibirá ayuda e instrucciones sobre cómo cuidar de usted misma y de su bebé recién nacido durante el postparto.  °Mientras esté en el hospital:  °· Es normal que sienta dolor o molestias en la incisión en el abdomen. Asegúrese de decirle a las enfermeras si siente dolor, así como donde siente el dolor y qué empeora el dolor. °· Si está amamantando, puede sentir contracciones dolorosas en el útero durante algunas semanas. Esto es normal. Las contracciones ayudan a que el útero vuelva a su tamaño normal. °· Es normal tener algo de sangrado después del parto. °· Durante los primeros 1-3 días después del parto, el flujo es de color rojo y la cantidad puede ser similar a un período. °· Es frecuente que el flujo se inicie y se detenga. °· En los primeros días, puede eliminar algunos coágulos pequeños. Informe a las enfermeras si comienza a eliminar coágulos grandes o aumenta el flujo. °· No  elimine los coágulos de sangre por el inodoro antes de que la enfermera los vea. °· Durante los próximos 3 a 10 días después del parto, el flujo debe ser más acuoso y rosado o marrón. °· De diez a catorce días después del parto, el flujo debe ser una pequeña cantidad de secreción de color blanco amarillento. °· La cantidad de flujo disminuirá en las primeras semanas después del parto. El flujo puede detenerse en 6-8 semanas. La mayoría de las mujeres no tienen más flujo a las 12 semanas después del parto. °· Usted debe cambiar sus apósitos con frecuencia. °· Lávese bien las manos con agua y jabón durante al menos 20 segundos después de cambiar el apósito, usar el  baño o antes de sostener o alimentar a su recién nacido. °· Se le quitará la vía intravenosa (IV) cuando ya esté bebiendo suficientes líquidos. °· El tubo de drenaje para la orina (catéter urinario) que se inserta antes del parto puede ser retirado luego de 6-8 horas después del parto o cuando las piernas vuelvan a tener sensibilidad. Usted puede sentir que tiene que vaciar la vejiga durante las primeras 6-8 horas después de que le quiten el catéter. °· Si se siente débil, mareada o se desmaya, llame a su enfermera antes de levantarse de la cama por primera vez y antes de tomar una ducha por primera vez. °· En los primeros días después del parto, podrá sentir las mamas sensibles y llenas. Esto se llama congestión. La sensibilidad en los senos por lo general desaparece dentro de las 48-72 horas después de que ocurre la congestión. También puede notar que la leche se escapa de sus senos. Si no está amamantando no estimule sus pechos. La estimulación de las mamas hace que sus senos produzcan más leche. °· Pasar tanto tiempo como sea posible con el bebé recién nacido es muy importante. Durante este tiempo, usted y su bebé deben sentirse cerca y conocerse uno al otro. Tener al bebé en su habitación (alojamiento conjunto) ayudará a fortalecer el vínculo con el bebé recién nacido. Esto le dará tiempo para conocerlo y atenderlo de manera cómoda. °· Las hormonas se modifican después del parto. A   veces, los cambios hormonales pueden causar tristeza o ganas de llorar por un tiempo. Estos sentimientos no deben durar ms de Hughes Supplyunos pocos das. Si duran ms que eso, debe hablar con su mdico.  Si lo desea, hable con su mdico acerca de los mtodos de planificacin familiar o mtodos anticonceptivos.  Hable con su mdico acerca de las vacunas. El mdico puede indicarle que se aplique las siguientes vacunas antes de salir del hospital:  [place] contra el ttanos, la difteria y la tos ferina (Tdap) o el ttanos y la difteria (Td).  Es muy importante que usted y su familia (incluyendo a los abuelos) u otras personas que cuidan al recin nacido estn al da con las vacunas Tdap o Td. Las vacunas Tdap o Td pueden ayudar a proteger al recin nacido de enfermedades.  Inmunizacin contra la rubola.  Inmunizacin contra la varicela.  Inmunizacin contra la gripe. Usted debe recibir esta vacunacin anual si no la ha recibido Academic librariandurante el embarazo.   Esta informacin no tiene Theme park managercomo fin reemplazar el consejo del mdico. Asegrese de hacerle al mdico cualquier pregunta que tenga.   Document Released: 08/20/2012 Elsevier Interactive Patient Education [employer]2016 Elsevier Inc. Endometritis (Endometritis) La endometritis es la irritacin, dolor e hinchazn (inflamacin) en la membrana que tapiza el tero (endometrio).  CAUSAS   Infecciones bacterianas.  Enfermedades de transmisin sexual (ETS).  Haber sufrido un aborto o despus de un parto, especialmente despus de un trabajo de parto prolongado o una cesrea.  Ciertos procedimientos ginecolgicos (dilatacin y curetaje, histeroscopa o la insercin de un dispositivo anticonceptivo). SIGNOS Y SNTOMAS   Fiebre.  Dolor en la zona baja del abdomen o dolor plvico.  Secrecin o sangrado vaginal anormal.  Hinchazn abdominal o meteorismo (distensin).  Malestar general, sentirse enferma.  Molestias al mover el intestino. DIAGNSTICO  Le indicarn un examen fsico y de la pelvis. Otras pruebas son:  Cultivos de material obtenido en el cuello del tero.  Anlisis de Bridgeportsangre.  Examen de [place]una muestra de tejido de las membranas que tapizan el tero (biopsia de endometrio).  Examen de las secreciones en el microscopio (preparado fresco).  Laparoscopa. TRATAMIENTO  Generalmente se indican antibiticos. Otros tratamientos pueden ser:  Lquidos que se administran por la vena a travs de una va intravenosa IV.  Reposo. INSTRUCCIONES PARA EL CUIDADO EN EL HOGAR   Slo  tome medicamentos de venta libre o recetados para Primary school teachercalmar el dolor, Environmental health practitionerel malestar o bajar la fiebre, segn las indicaciones de su mdico.  CenterPoint Energyome los antibiticos como se le indic. Tmelos todos, aunque se sienta mejor.  Puede reanudar su dieta y sus actividades normales segn se le haya indicado o segn su tolerancia.  No se haga duchas vaginales ni tenga relaciones sexuales hasta que el mdico la autorice.  No tenga relaciones sexuales hasta que su compaero haya sido tratado si la causa de la endometritis es una enfermedad de transmisin sexual. SOLICITE ATENCIN MDICA DE INMEDIATO SI:   Presenta hinchazn o aumento del dolor en el abdomen.  Tiene fiebre.  Tiene una secrecin vaginal con mal olor, o ha aumentado la cantidad de Midwifesecrecin.  Brett Fairybserva una hemorragia vaginal anormal.  El medicamento no le Scientist, clinical (histocompatibility and immunogenetics)calma el dolor.  Tiene algn problema que puede relacionarse con el medicamento que est tomando.  Comienza a sentir nuseas y vmitos o no puede Comcastretener los alimentos.  Siente dolor al mover el intestino. ASEGRESE DE QUE:   Comprende estas instrucciones.  Controlar su afeccin.  Recibir ayuda de inmediato si no  mejora o si empeora.   Esta informacin no tiene Theme park managercomo fin reemplazar el consejo del mdico. Asegrese de hacerle al mdico cualquier pregunta que tenga.   Document Released: 06/13/2005 Document Revised: 09/08/2013 Elsevier Interactive Patient Education [employer]2016 Elsevier Inc.

## 2016-03-14 NOTE — Progress Notes (Signed)
I stopped by to check on patient's needs and ordered her breakfast. Eda H Royal Interpreter.

## 2016-03-14 NOTE — Discharge Summary (Signed)
OB Discharge Summary     Patient Name: Alejandra Peterson DOB: December 20, 1975 MRN: [id number]  Date of admission: 03/06/2016 Delivering MD: Reva BoresPRATT, TANYA S   Date of discharge: 03/14/2016  Admitting diagnosis: 40 WEEKS CTX Intrauterine pregnancy: 1345w4d     Secondary diagnosis:  Principal Problem:   S/P primary low transverse C-section Active Problems:   Supervision of low-risk pregnancy   Thrombocytopenia during pregnancy (HCC)   Fetal arrhythmia affecting pregnancy, antepartum   ROM (rupture of membranes), premature   Chorioamnionitis   Endometritis   Preeclampsia, severe  Additional problems: None     Discharge diagnosis: Term Pregnancy Delivered and Preeclampsia (severe)                                                                                                Post partum procedures:None  Augmentation: Pitocin  Complications: Intrauterine Inflammation or infection (Chorioamniotis) and ROM>24 hours  Hospital course:  Onset of Labor With Unplanned C/S  40 y.o. yo G4P1030 at 1645w4d was admitted in Latent Labor on 03/06/2016 with PROM and scant PNC [place].  Patient had a labor course significant for pLTCS for failed vacuum and failure to descend. Membrane Rupture Time/Date: 5:00 AM ,03/06/2016   The patient went for cesarean section due to Arrest of Descent and failed vaccum, and delivered a Viable infant,03/07/2016  Details of operation can be found in separate operative note. Patient had an uncomplicated postpartum course.  She is ambulating,tolerating a regular diet, passing flatus, and urinating well.  Patient is discharged home in stable condition 03/14/2016.  Postpartum course was complicated by severe pre-eclampsia that required 24hr prophylactic magnesium.  Pt also had persistent fevers despite traditional antiobiotic coverage for Triple I, required to be switched to zosyn for 48 hrs.  Today will be discharged with augmentin to complete a 7 day  course for presumed endometritis.  Pt had CT, renal ultrasound, blood cultures and urine cultures which all resulted negative.  Pt will be discharged today afebrile for 48 hours and doing well.   Physical exam  Filed Vitals:   03/13/16 0500 03/13/16 0845 03/13/16 1900 03/14/16 0536  BP: 138/74  117/66 133/68  Pulse: 65 64 84 72  Temp: 98.4 F (36.9 C)  99 F (37.2 C)   TempSrc: Oral  Oral   Resp: 18 18 18 18   Height:      Weight:      SpO2:       General: alert, cooperative and no distress Lochia: appropriate Uterine Fundus: firm Incision: Healing well with no significant drainage, No significant erythema, Dressing is clean, dry, and intact DVT Evaluation: No evidence of DVT seen on physical exam. Labs: Lab Results  Component Value Date   WBC 8.5 03/13/2016   HGB 10.3* 03/13/2016   HCT 30.1* 03/13/2016   MCV 92.0 03/13/2016   PLT 179 03/13/2016   CMP Latest Ref Rng 03/13/2016  Glucose 65 - 99 mg/dL 87  BUN 6 - 20 mg/dL <4(U<5(L)  Creatinine 9.810.44 - 1.00 mg/dL 1.910.45  Sodium 478135 - 295145 mmol/L 135  Potassium 3.5 -  5.1 mmol/L 3.6  Chloride 101 - 111 mmol/L 105  CO2 22 - 32 mmol/L 26  Calcium 8.9 - 10.3 mg/dL 1.6(X8.5(L)  Total Protein 6.5 - 8.1 g/dL -  Total Bilirubin 0.3 - 1.2 mg/dL -  Alkaline Phos 38 - 096126 U/L -  AST 15 - 41 U/L -  ALT 14 - 54 U/L -    Discharge instruction: per After Visit Summary and "Baby and Me Booklet".  After visit meds:    Medication List    TAKE these medications        amoxicillin-clavulanate 875-125 MG tablet  Commonly known as:  AUGMENTIN  Take 1 tablet by mouth 2 (two) times daily. For 5 days     docusate sodium 100 MG capsule  Commonly known as:  COLACE  Take 1 capsule (100 mg total) by mouth 2 (two) times daily.     oxyCODONE-acetaminophen 5-325 MG tablet  Commonly known as:  PERCOCET/ROXICET  Take 1-2 tablets by mouth every 6 (six) hours as needed for severe pain.     Prenatal Vitamins 0.8 MG tablet  Take 1 tablet by mouth daily.      simethicone 80 MG chewable tablet  Commonly known as:  MYLICON  Chew 1 tablet (80 mg total) by mouth 2 (two) times daily.        Diet: routine diet  Activity: Advance as tolerated. Pelvic rest for 6 weeks.   Outpatient follow up:6 weeks  Postpartum contraception: Undecided  Newborn Data: Live born female  Birth Weight: 8 lb 6 oz (3800 g) APGAR: 1, 5  Baby Feeding: Bottle and Breast Disposition:home with mother   03/14/2016 Olena LeatherwoodKelly M Aguilar, MD  OB fellow attestation I have seen and examined this patient and agree with above documentation in the resident's note.   Alejandra Peterson is a 40 y.o. [phone] s/p pLTCS for arrest of descent, failed vacuum.   Pain is well controlled.  Plan for birth control is undecided.  Method of Feeding: breast- infant in NICU (pumping)  PE:  BP 128/78 mmHg  Pulse 83  Temp(Src) 99.1 F (37.3 C) (Oral)  Resp 18  Ht 5\' 7"  (1.702 m)  Wt 185 lb 3 oz (84 kg)  BMI 28.73 kg/m2  SpO2 98%  LMP 06/04/2015  Breastfeeding? Unknown Gen: well appearing Heart: reg rate Lungs: normal WOB Fundus firm Ext: soft, no pain, no edema  No results for input(s): HGB, HCT in the last 72 hours.  Plan: discharge today - postpartum care discussed - Endometritis: continue Augmentin for 5 additional days of treatment  - Preeclampsia: no sx today and BP wnl. Received magnesium. She does not need additional BP check because she was inpatient at 72 hrs and 1 week for BP check.  - f/u clinic in 6 weeks for postpartum visit   Federico FlakeKimberly Niles Saadiq Poche, MD 11:22 AM

## 2016-03-14 NOTE — Progress Notes (Signed)
POSTPARTUM PROGRESS NOTE  Post Partum Day 7 Subjective:  Alejandra Peterson is a 40 y.o. [id number] 5515w4d s/p pltcs for fetal indications. Patient complains of new severe right flank pain beginning yesterday afternoon. No dysuria. No fevers. Tolerating diet, passing flatus.  No acute events overnight.  Pt denies problems with ambulating, voiding or po intake.  She denies nausea or vomiting.  Pain is moderately controlled.  She has had flatus. She has had bowel movement.  Lochia Small.   Objective: Blood pressure 133/68, pulse 72, temperature 99 F (37.2 C), temperature source Oral, resp. rate 18, height 5\' 7"  (1.702 m), weight 185 lb 3 oz (84 kg), last menstrual period 06/04/2015, SpO2 99 %, unknown if currently breastfeeding.  Physical Exam:  General: alert, cooperative and no distress Lochia:normal flow Chest: CTAB Heart: RRR no m/r/g Abdomen: +BS, soft, nontender, right flank pain  Uterine Fundus: firm,  DVT Evaluation: No calf swelling or tenderness Extremities: trace edema   Recent Labs  03/12/16 0715 03/13/16 0602  HGB 9.8* 10.3*  HCT 28.1* 30.1*    Assessment/Plan:  ASSESSMENT: Alejandra Peterson is a 40 y.o. G4P1030 6015w4d s/p pltcs.  # PP endometritis - now afebrile 48 hours, transitioned to augmention. Will continue  # new right flank pain - pt says lots of walking yesterday so possible incisional pain, but severity and abruptness of onset leads me to w/u further. Will check renal u/s to eval for ureteral damage leading to hydronephrosis. Will also check cathed u/a.  # severe preE - asymptomatic, s/p mg, BP wnl - continue to monitor  D/c this PM if w/u unrevealing   LOS: 8 days   Silvano Bilisoah B Sione Baumgarten 03/14/2016, 7:30 AM

## 2016-03-15 LAB — CULTURE, BLOOD (ROUTINE X 2)
CULTURE: NO GROWTH
Culture: NO GROWTH

## 2016-05-17 ENCOUNTER — Telehealth: Payer: Self-pay | Admitting: *Deleted

## 2016-05-17 ENCOUNTER — Ambulatory Visit: Payer: Medicaid Other | Admitting: Obstetrics and Gynecology

## 2016-05-17 ENCOUNTER — Encounter: Payer: Self-pay | Admitting: *Deleted

## 2016-05-17 NOTE — Telephone Encounter (Signed)
Patient missed a scheduled appointment for postpartum visit. Had had preeclampsia.  Called patient and left a message you missed a scheduled appointment- please call our office to reschedule. Will send letter.

## 2017-06-14 IMAGING — US US MFM OB COMP +14 WKS
1 series · 14 of 28 positions shown · non-contrast
Comparison: none

[Series 1: us mfm ob comp +14 wks · 14 of 119 slices shown]
[im 5/119]
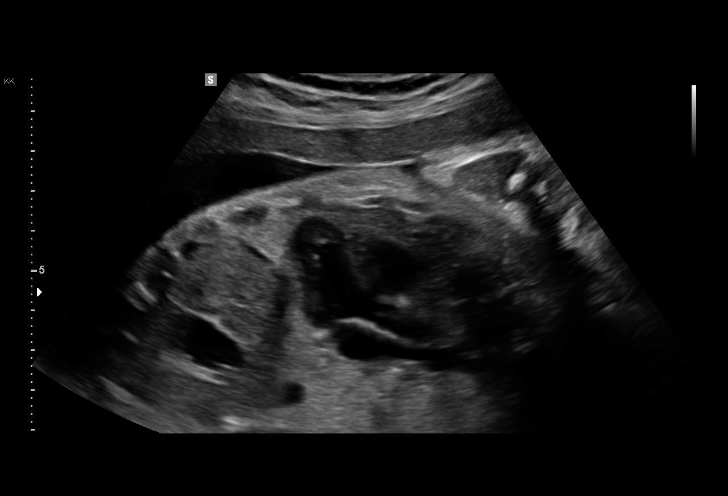
[im 14/119]
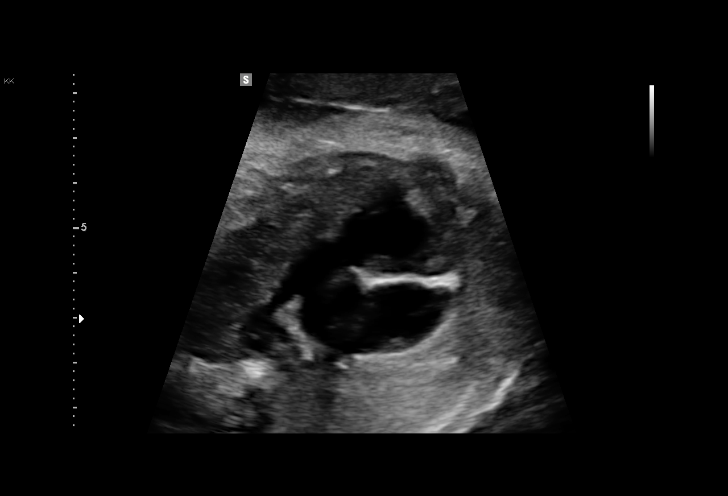
[im 22/119]
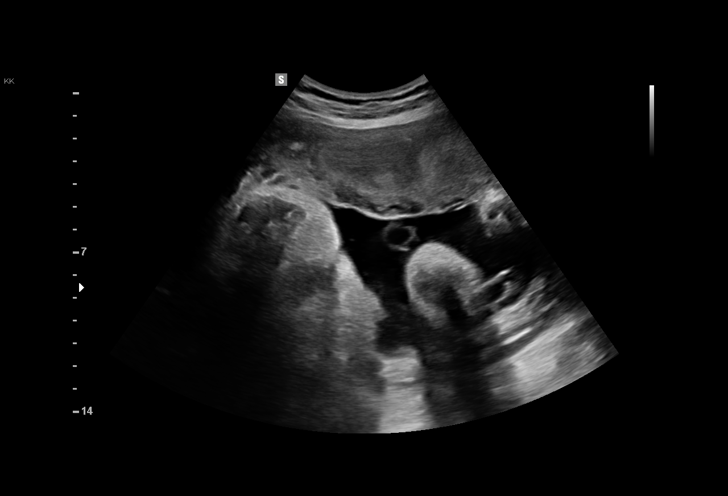
[im 31/119]
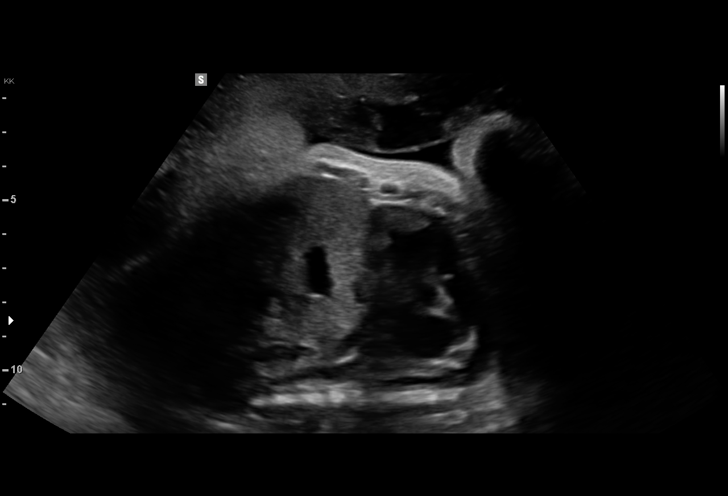
[im 40/119]
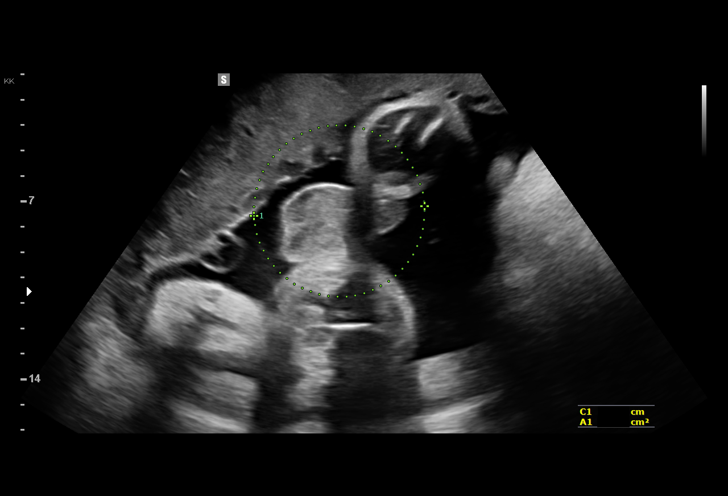
[im 49/119]
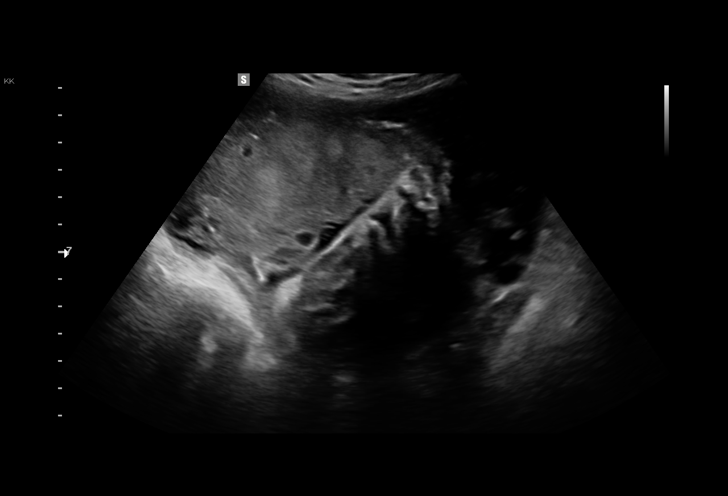
[im 57/119]
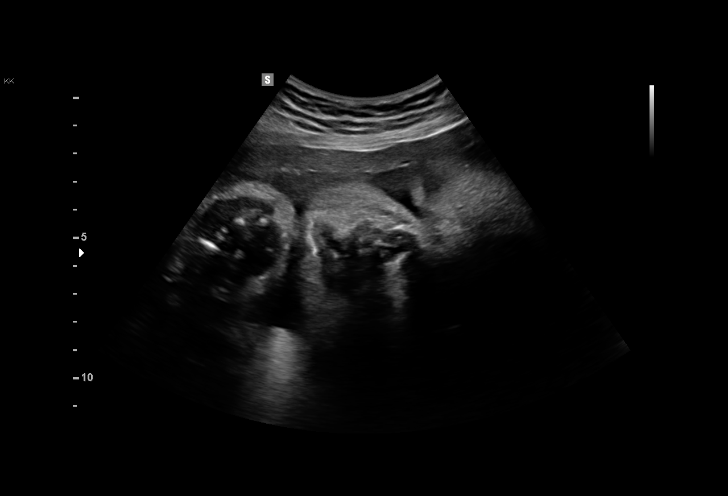
[im 66/119]
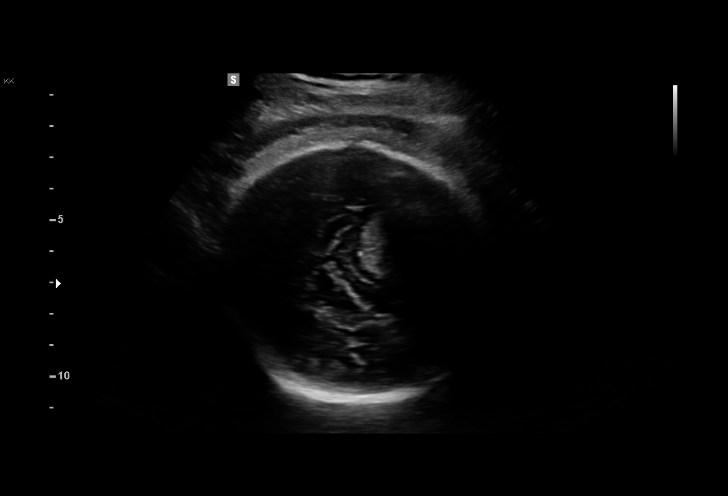
[im 75/119]
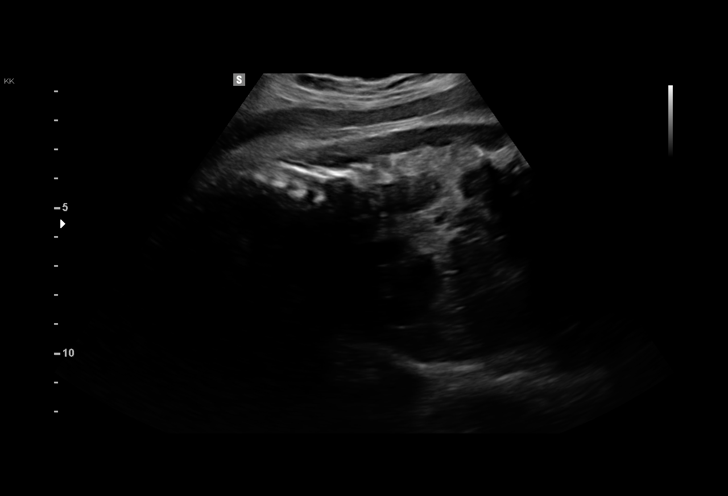
[im 84/119]
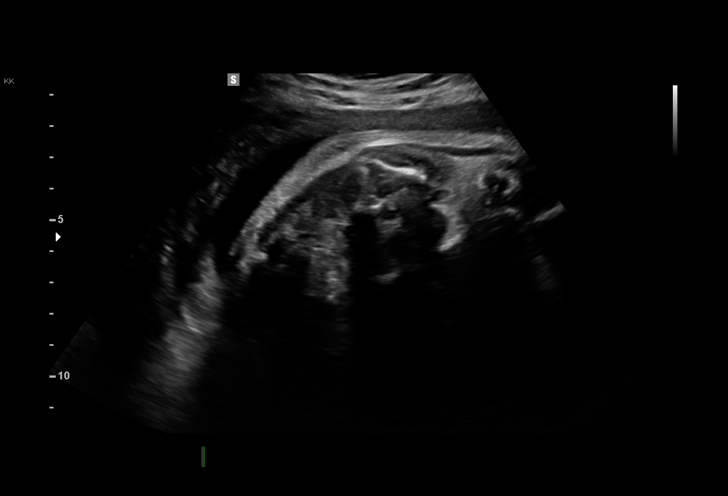
[im 92/119]
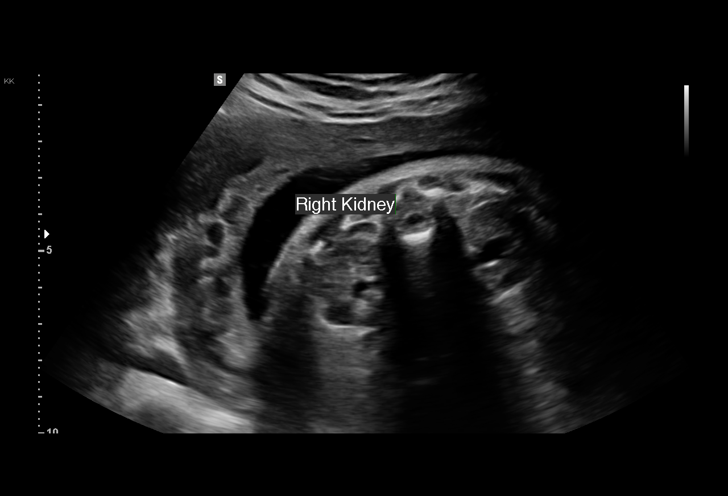
[im 101/119]
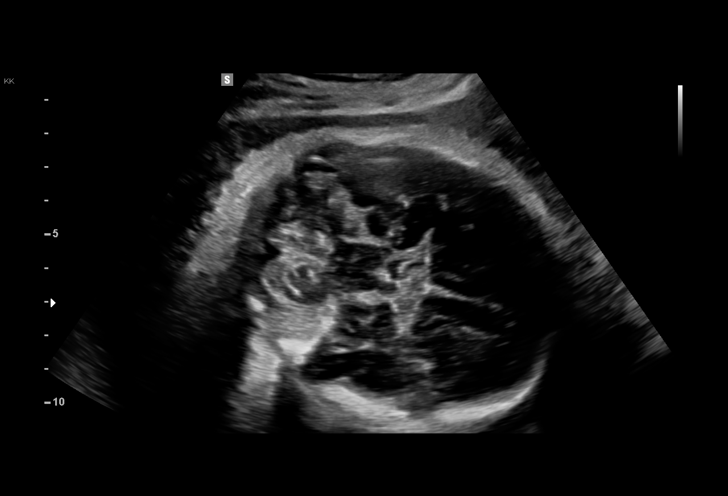
[im 110/119]
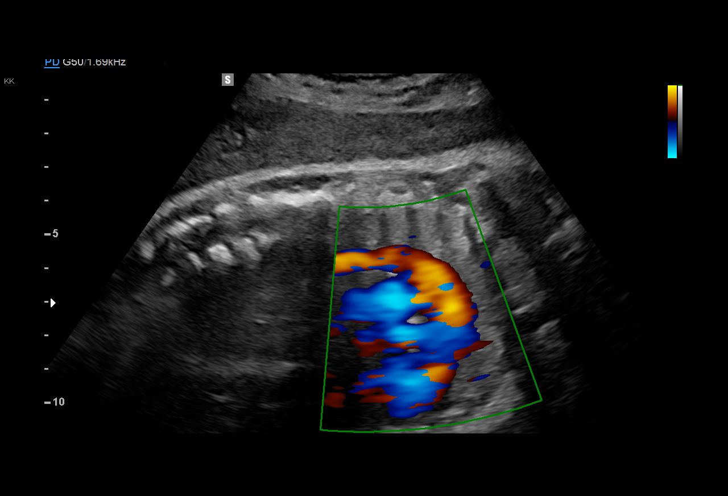
[im 119/119]
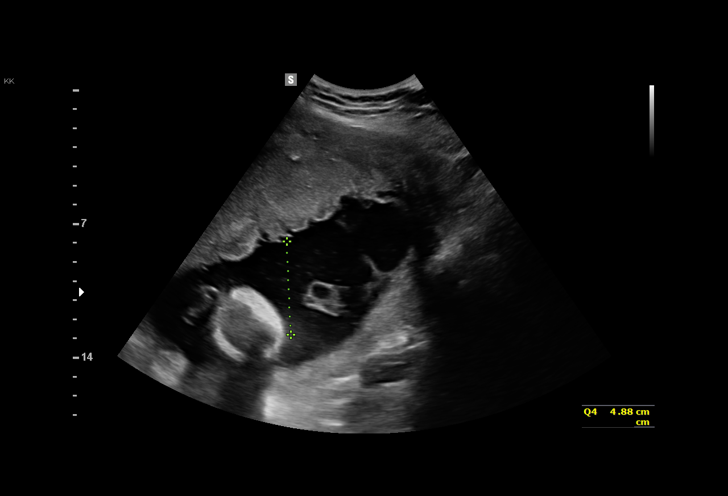

[14 of 28 positions shown; findings below may reference images not displayed]

Indications

35 weeks gestation of pregnancy
Late to prenatal care, third trimester
Basic anatomic survey                          Z36
Advanced maternal age multigravida 35+,
third trimester
Poor obstetric history-Recurrent (habitual)
abortion (3 consecutive ab's)
OB History

Gravidity:    4         Term:   0        Prem:   0         SAB:   3
TOP:          0       Ectopic:  0        Living: 0
Fetal Evaluation

Num Of Fetuses:     1
Fetal Heart         153
Rate(bpm):
Cardiac Activity:   Observed
Presentation:       Cephalic
Placenta:           Anterior, above cervical os
P. Cord Insertion:  Not well visualized

Amniotic Fluid
AFI FV:      Subjectively within normal limits

AFI Sum(cm)     %Tile       Largest Pocket(cm)
22.84           87

RUQ(cm)       RLQ(cm)       LUQ(cm)        LLQ(cm)
5.64
Biometry
BPD:      87.2  mm     G. Age:  35w 1d         45  %    CI:         75.68  %    70 - 86
FL/HC:       21.5  %    20.1 -
HC:      317.8  mm     G. Age:  35w 5d         23  %    HC/AC:       0.98       0.93 -
AC:      323.6  mm     G. Age:  36w 2d         77  %    FL/BPD:      78.4  %    71 - 87
FL:       68.4  mm     G. Age:  35w 1d         33  %    FL/AC:       21.1  %    20 - 24
HUM:      61.8  mm     G. Age:  35w 5d         69  %

Est. FW:    3118   gm     6 lb 2 oz     68  %
Gestational Age

LMP:           34w 2d        Date:  06/04/15                 EDD:    03/10/16
U/S Today:     35w 4d                                        EDD:    03/01/16
Best:          35w 4d     Det. By:  U/S (01/30/16)           EDD:    03/01/16
Anatomy

Cranium:               Appears normal         Aortic Arch:            Appears normal
Cavum:                 Appears normal         Ductal Arch:            Appears normal
Ventricles:            Appears normal         Diaphragm:              Appears normal
Choroid Plexus:        Appears normal         Stomach:                Appears normal, left
sided
Cerebellum:            Appears normal         Abdomen:                Appears normal
Posterior Fossa:       Appears normal         Abdominal Wall:         Not well visualized
Nuchal Fold:           Not applicable (>20    Cord Vessels:           Appears normal (3
wks GA)                                        vessel cord)
Face:                  Appears normal         Kidneys:                Appear normal
(orbits and profile)
Lips:                  Appears normal         Bladder:                Appears normal
Thoracic:              Appears normal         Spine:                  Appears normal
Heart:                 Appears normal         Upper Extremities:      Appears normal
(4CH, axis, and
situs)
RVOT:                  Appears normal         Lower Extremities:      Appears normal
LVOT:                  Appears normal

Other:  Male gender. Technically difficult due to advanced GA and fetal
position.
Cervix Uterus Adnexa

Cervix
Not visualized (advanced GA >06wks)
Impression

SIUP at 35+4 weeks
Cephalic presentation
Normal detailed fetal anatomy; limited views of CI
Normal amniotic fluid volume
EDC based on today's measurements: 03/01/16
Recommendations

Follow-up as clinically indicated

## 2017-07-28 IMAGING — US US RENAL
1 series · 15 of 25 positions shown · non-contrast
Comparison: CT abdomen and pelvis March 12, 2016

CLINICAL DATA: Right flank pain.  Recent cesarian section

EXAM:
RENAL / URINARY TRACT ULTRASOUND COMPLETE

[Series 1: us renal · 15 of 31 slices shown]
[im 1/31]
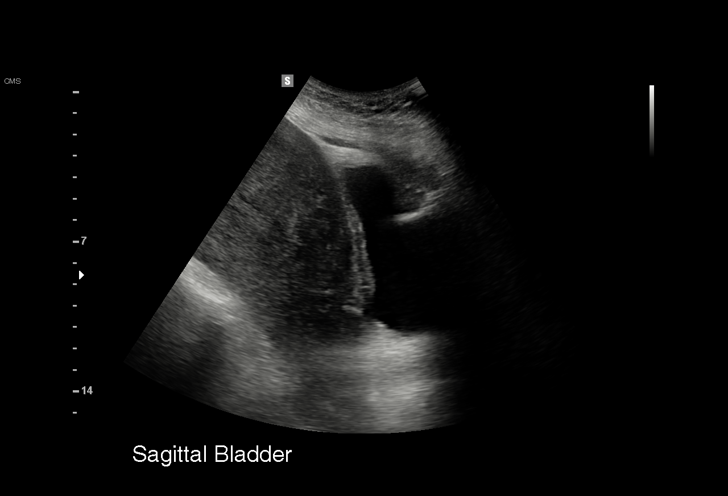
[im 3/31]
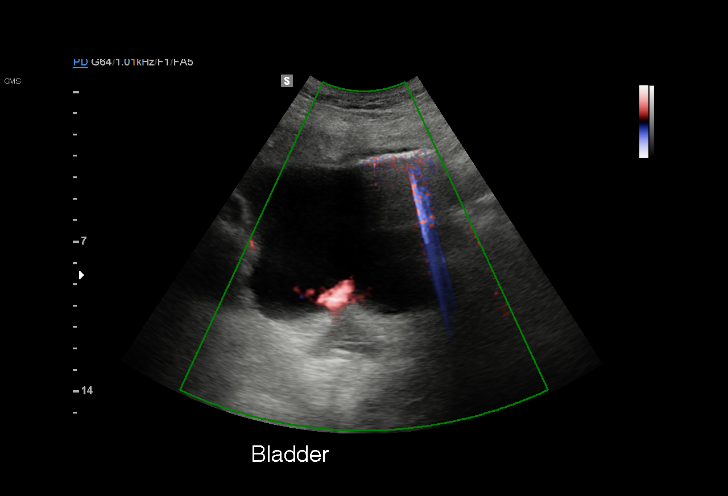
[im 6/31]
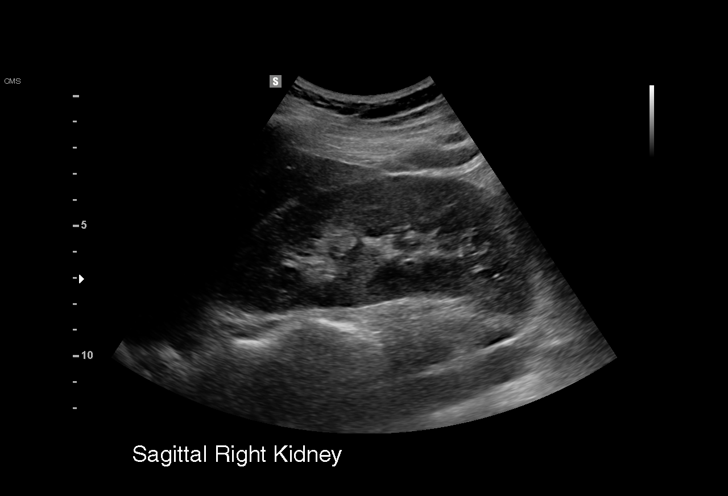
[im 7/31]
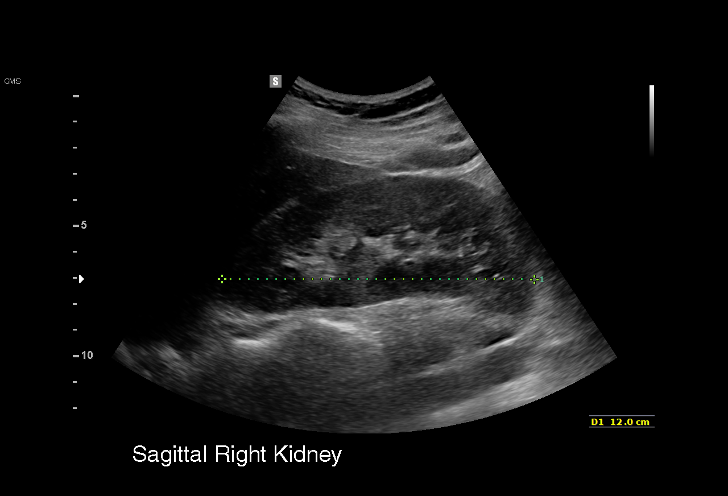
[im 9/31]
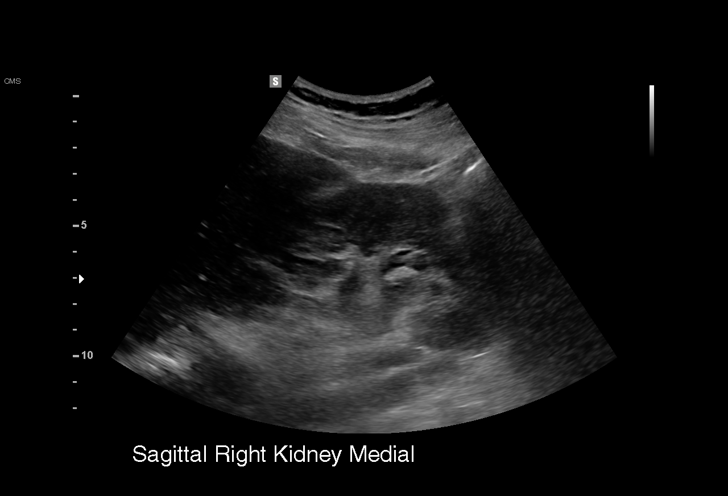
[im 12/31]
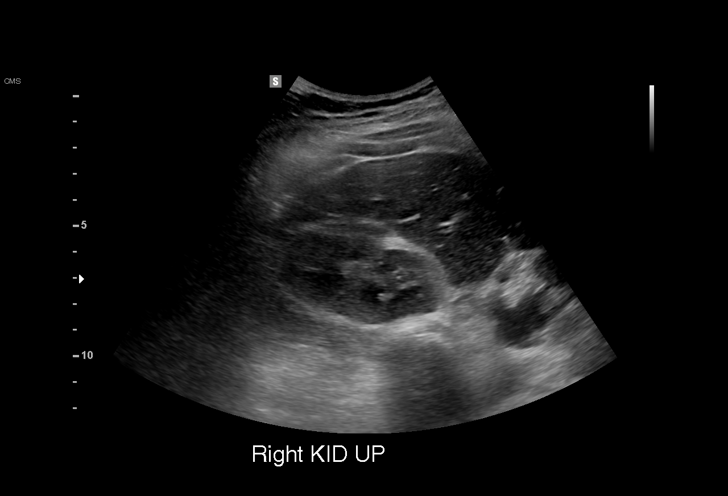
[im 13/31]
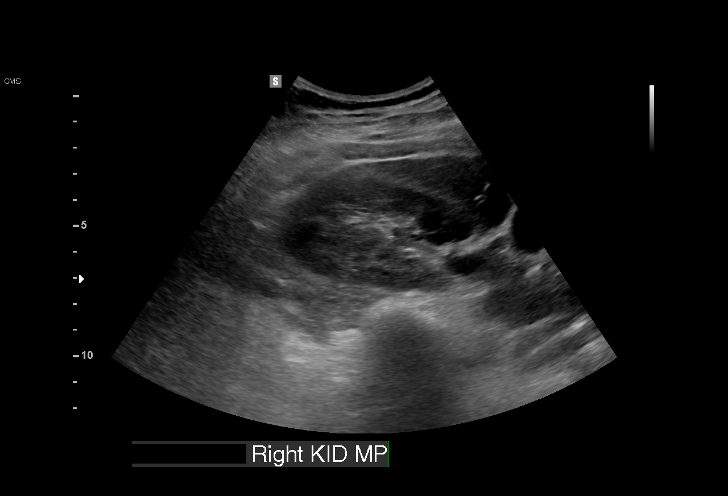
[im 16/31]
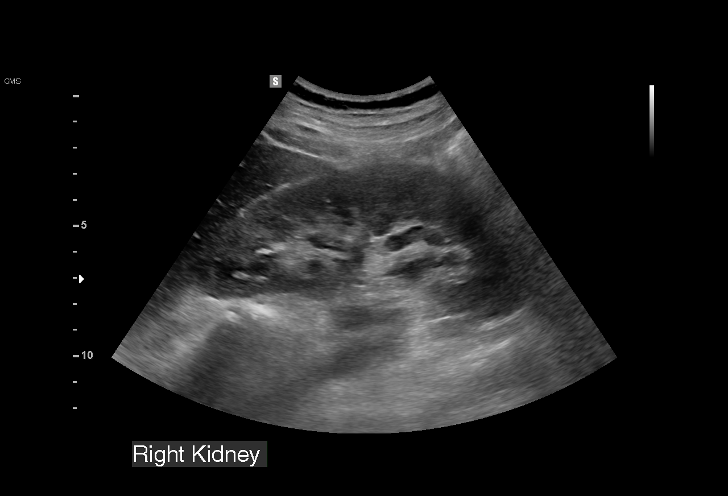
[im 18/31]
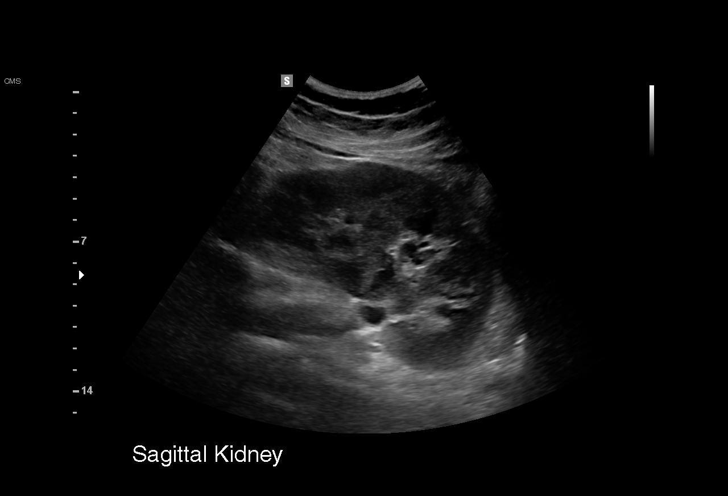
[im 19/31]
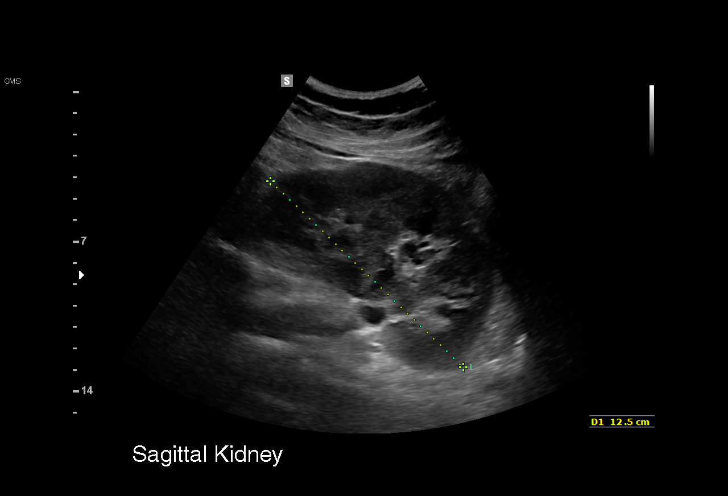
[im 22/31]
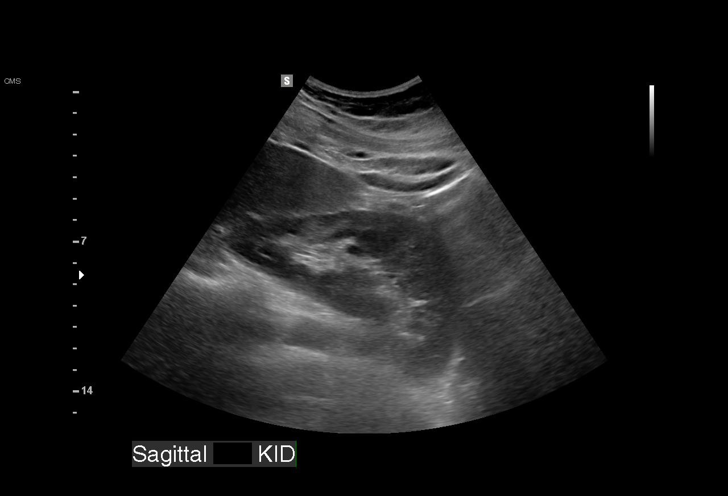
[im 24/31]
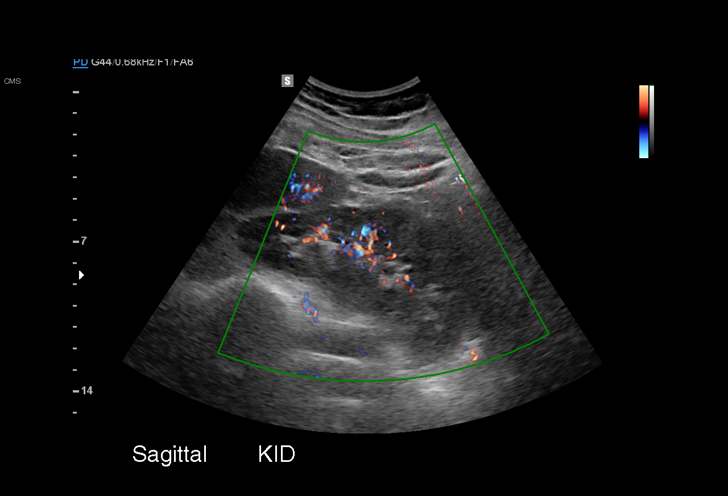
[im 26/31]
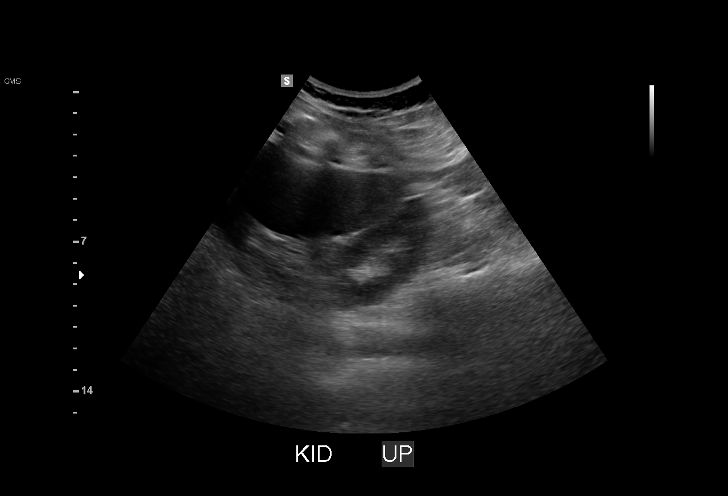
[im 28/31]
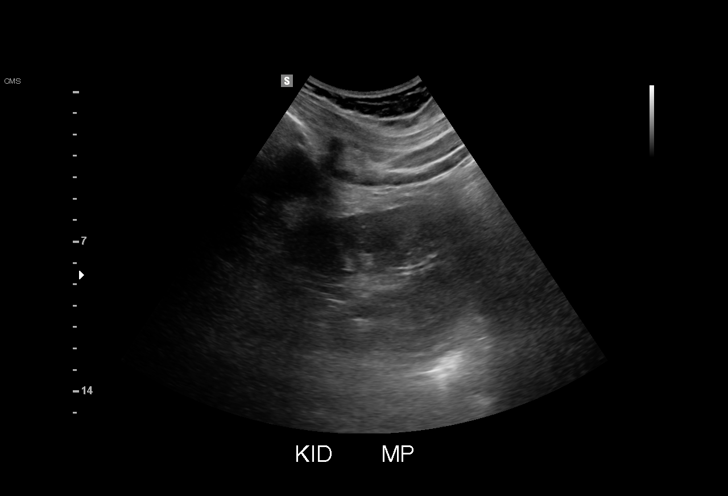
[im 31/31]
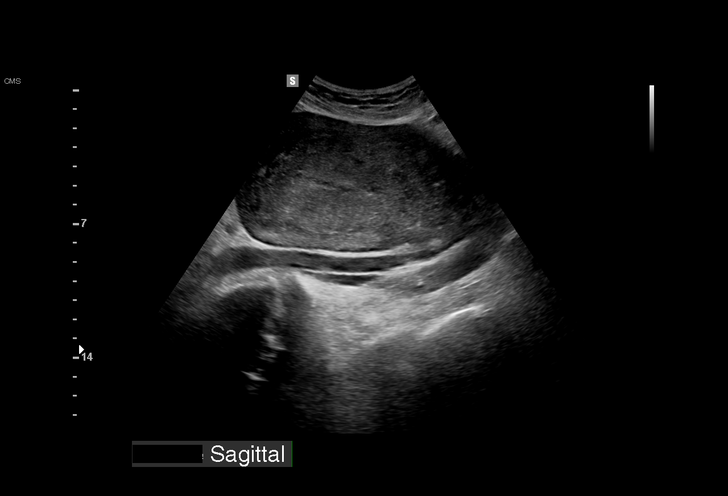

[15 of 25 positions shown; findings below may reference images not displayed]

FINDINGS: Right Kidney:

Length: 13.3 cm. Echogenicity and renal cortical thickness are
within normal limits. No mass, perinephric fluid, or hydronephrosis
visualized. No sonographically demonstrable calculus or
ureterectasis.

Left Kidney:

Length: 12.5 cm. Echogenicity and renal cortical thickness are
within normal limits. No mass, perinephric fluid, or hydronephrosis
visualized. No sonographically demonstrable calculus or
ureterectasis.

Bladder:

Appears normal for degree of bladder distention.
IMPRESSION: Study within normal limits.
